# Patient Record
Sex: Female | Born: 1960 | Race: Asian | Hispanic: No | Marital: Married | Smoking: Never smoker
Health system: Southern US, Community
[De-identification: ages and names within clinical notes are randomized; demographics above are authoritative.]

## PROBLEM LIST (undated history)

## (undated) DIAGNOSIS — E7800 Pure hypercholesterolemia, unspecified: Secondary | ICD-10-CM

## (undated) DIAGNOSIS — I10 Essential (primary) hypertension: Secondary | ICD-10-CM

## (undated) DIAGNOSIS — E119 Type 2 diabetes mellitus without complications: Secondary | ICD-10-CM

## (undated) DIAGNOSIS — H9190 Unspecified hearing loss, unspecified ear: Secondary | ICD-10-CM

## (undated) HISTORY — PX: OTHER SURGICAL HISTORY: SHX169

---

## 2020-11-07 ENCOUNTER — Emergency Department (HOSPITAL_BASED_OUTPATIENT_CLINIC_OR_DEPARTMENT_OTHER): Payer: Self-pay

## 2020-11-07 ENCOUNTER — Other Ambulatory Visit: Payer: Self-pay

## 2020-11-07 ENCOUNTER — Encounter (HOSPITAL_BASED_OUTPATIENT_CLINIC_OR_DEPARTMENT_OTHER): Payer: Self-pay | Admitting: *Deleted

## 2020-11-07 ENCOUNTER — Inpatient Hospital Stay (HOSPITAL_BASED_OUTPATIENT_CLINIC_OR_DEPARTMENT_OTHER)
Admission: EM | Admit: 2020-11-07 | Discharge: 2020-11-11 | DRG: 189 | Disposition: A | Discharge status: Home / Self Care | Payer: Self-pay | Attending: Internal Medicine | Admitting: Internal Medicine

## 2020-11-07 DIAGNOSIS — N179 Acute kidney failure, unspecified: Secondary | ICD-10-CM | POA: Diagnosis present

## 2020-11-07 DIAGNOSIS — Z7982 Long term (current) use of aspirin: Secondary | ICD-10-CM

## 2020-11-07 DIAGNOSIS — Q256 Stenosis of pulmonary artery: Secondary | ICD-10-CM

## 2020-11-07 DIAGNOSIS — A419 Sepsis, unspecified organism: Secondary | ICD-10-CM | POA: Diagnosis present

## 2020-11-07 DIAGNOSIS — Z20822 Contact with and (suspected) exposure to covid-19: Secondary | ICD-10-CM | POA: Diagnosis present

## 2020-11-07 DIAGNOSIS — J9811 Atelectasis: Secondary | ICD-10-CM | POA: Diagnosis present

## 2020-11-07 DIAGNOSIS — Z833 Family history of diabetes mellitus: Secondary | ICD-10-CM

## 2020-11-07 DIAGNOSIS — I5031 Acute diastolic (congestive) heart failure: Secondary | ICD-10-CM | POA: Diagnosis present

## 2020-11-07 DIAGNOSIS — M8580 Other specified disorders of bone density and structure, unspecified site: Secondary | ICD-10-CM | POA: Diagnosis present

## 2020-11-07 DIAGNOSIS — H6121 Impacted cerumen, right ear: Secondary | ICD-10-CM | POA: Diagnosis present

## 2020-11-07 DIAGNOSIS — M11241 Other chondrocalcinosis, right hand: Secondary | ICD-10-CM | POA: Diagnosis present

## 2020-11-07 DIAGNOSIS — D509 Iron deficiency anemia, unspecified: Secondary | ICD-10-CM | POA: Diagnosis present

## 2020-11-07 DIAGNOSIS — E1165 Type 2 diabetes mellitus with hyperglycemia: Secondary | ICD-10-CM | POA: Diagnosis present

## 2020-11-07 DIAGNOSIS — D649 Anemia, unspecified: Secondary | ICD-10-CM | POA: Diagnosis present

## 2020-11-07 DIAGNOSIS — J9621 Acute and chronic respiratory failure with hypoxia: Principal | ICD-10-CM | POA: Diagnosis present

## 2020-11-07 DIAGNOSIS — Z794 Long term (current) use of insulin: Secondary | ICD-10-CM

## 2020-11-07 DIAGNOSIS — I959 Hypotension, unspecified: Secondary | ICD-10-CM | POA: Diagnosis present

## 2020-11-07 DIAGNOSIS — E785 Hyperlipidemia, unspecified: Secondary | ICD-10-CM | POA: Diagnosis present

## 2020-11-07 DIAGNOSIS — I10 Essential (primary) hypertension: Secondary | ICD-10-CM | POA: Diagnosis present

## 2020-11-07 DIAGNOSIS — R911 Solitary pulmonary nodule: Secondary | ICD-10-CM | POA: Diagnosis present

## 2020-11-07 DIAGNOSIS — M79641 Pain in right hand: Secondary | ICD-10-CM

## 2020-11-07 DIAGNOSIS — E663 Overweight: Secondary | ICD-10-CM | POA: Diagnosis present

## 2020-11-07 DIAGNOSIS — Z6828 Body mass index (BMI) 28.0-28.9, adult: Secondary | ICD-10-CM

## 2020-11-07 DIAGNOSIS — H9190 Unspecified hearing loss, unspecified ear: Secondary | ICD-10-CM | POA: Diagnosis present

## 2020-11-07 DIAGNOSIS — R0902 Hypoxemia: Secondary | ICD-10-CM

## 2020-11-07 DIAGNOSIS — Z7984 Long term (current) use of oral hypoglycemic drugs: Secondary | ICD-10-CM

## 2020-11-07 HISTORY — DX: Unspecified hearing loss, unspecified ear: H91.90

## 2020-11-07 HISTORY — DX: Essential (primary) hypertension: I10

## 2020-11-07 HISTORY — DX: Type 2 diabetes mellitus without complications: E11.9

## 2020-11-07 HISTORY — DX: Pure hypercholesterolemia, unspecified: E78.00

## 2020-11-07 LAB — CBC WITH DIFFERENTIAL/PLATELET
Abs Immature Granulocytes: 0.05 10*3/uL (ref 0.00–0.07)
Basophils Absolute: 0 10*3/uL (ref 0.0–0.1)
Basophils Relative: 0 %
Eosinophils Absolute: 0 10*3/uL (ref 0.0–0.5)
Eosinophils Relative: 0 %
HCT: 32.6 % — ABNORMAL LOW (ref 36.0–46.0)
Hemoglobin: 10.3 g/dL — ABNORMAL LOW (ref 12.0–15.0)
Immature Granulocytes: 1 %
Lymphocytes Relative: 20 %
Lymphs Abs: 2.1 10*3/uL (ref 0.7–4.0)
MCH: 24.8 pg — ABNORMAL LOW (ref 26.0–34.0)
MCHC: 31.6 g/dL (ref 30.0–36.0)
MCV: 78.6 fL — ABNORMAL LOW (ref 80.0–100.0)
Monocytes Absolute: 0.8 10*3/uL (ref 0.1–1.0)
Monocytes Relative: 8 %
Neutro Abs: 7.4 10*3/uL (ref 1.7–7.7)
Neutrophils Relative %: 71 %
Platelets: 327 10*3/uL (ref 150–400)
RBC: 4.15 MIL/uL (ref 3.87–5.11)
RDW: 15.8 % — ABNORMAL HIGH (ref 11.5–15.5)
WBC: 10.4 10*3/uL (ref 4.0–10.5)
nRBC: 0 % (ref 0.0–0.2)

## 2020-11-07 LAB — BASIC METABOLIC PANEL
Anion gap: 11 (ref 5–15)
BUN: 29 mg/dL — ABNORMAL HIGH (ref 6–20)
CO2: 29 mmol/L (ref 22–32)
Calcium: 9.3 mg/dL (ref 8.9–10.3)
Chloride: 94 mmol/L — ABNORMAL LOW (ref 98–111)
Creatinine, Ser: 1.27 mg/dL — ABNORMAL HIGH (ref 0.44–1.00)
GFR, Estimated: 48 mL/min — ABNORMAL LOW (ref >60)
Glucose, Bld: 161 mg/dL — ABNORMAL HIGH (ref 70–99)
Potassium: 4 mmol/L (ref 3.5–5.1)
Sodium: 134 mmol/L — ABNORMAL LOW (ref 135–145)

## 2020-11-07 LAB — TROPONIN I (HIGH SENSITIVITY): Troponin I (High Sensitivity): 5 ng/L (ref <18)

## 2020-11-07 LAB — RESP PANEL BY RT-PCR (FLU A&B, COVID) ARPGX2
Influenza A by PCR: NEGATIVE
Influenza B by PCR: NEGATIVE
SARS Coronavirus 2 by RT PCR: NEGATIVE

## 2020-11-07 LAB — SEDIMENTATION RATE: Sed Rate: 41 mm/hr — ABNORMAL HIGH (ref 0–22)

## 2020-11-07 LAB — C-REACTIVE PROTEIN: CRP: 11.8 mg/dL — ABNORMAL HIGH (ref <1.0)

## 2020-11-07 LAB — URIC ACID: Uric Acid, Serum: 4.9 mg/dL (ref 2.5–7.1)

## 2020-11-07 MED ORDER — NAPROXEN 375 MG PO TABS: 375.0000 mg | ORAL_TABLET | Freq: Two times a day (BID) | ORAL | 0 refills | Status: DC

## 2020-11-07 MED ORDER — CEPHALEXIN 250 MG PO CAPS
500.0000 mg | ORAL_CAPSULE | Freq: Once | ORAL | Status: AC
  Administered 2020-11-07: 500 mg via ORAL
  Filled 2020-11-07: qty 2

## 2020-11-07 MED ORDER — NALOXONE HCL 0.4 MG/ML IJ SOLN
0.4000 mg | Freq: Once | INTRAMUSCULAR | Status: AC
  Administered 2020-11-07: 0.4 mg via INTRAVENOUS
  Filled 2020-11-07: qty 1

## 2020-11-07 MED ORDER — IOHEXOL 350 MG/ML SOLN
75.0000 mL | Freq: Once | INTRAVENOUS | Status: AC | PRN
  Administered 2020-11-07: 75 mL via INTRAVENOUS

## 2020-11-07 MED ORDER — HYDROCODONE-ACETAMINOPHEN 5-325 MG PO TABS: 1.0000 | ORAL_TABLET | Freq: Four times a day (QID) | ORAL | 0 refills | Status: DC | PRN

## 2020-11-07 MED ORDER — OXYCODONE-ACETAMINOPHEN 5-325 MG PO TABS
1.0000 | ORAL_TABLET | Freq: Once | ORAL | Status: AC
  Administered 2020-11-07: 1 via ORAL
  Filled 2020-11-07: qty 1

## 2020-11-07 MED ORDER — ONDANSETRON 4 MG PO TBDP: 4.0000 mg | ORAL_TABLET | Freq: Three times a day (TID) | ORAL | 0 refills | Status: AC | PRN

## 2020-11-07 MED ORDER — SODIUM CHLORIDE 0.9 % IV BOLUS
1000.0000 mL | Freq: Once | INTRAVENOUS | Status: AC
  Administered 2020-11-07: 1000 mL via INTRAVENOUS

## 2020-11-07 MED ORDER — ONDANSETRON 4 MG PO TBDP
4.0000 mg | ORAL_TABLET | Freq: Once | ORAL | Status: AC
  Administered 2020-11-07: 4 mg via ORAL
  Filled 2020-11-07: qty 1

## 2020-11-07 MED ORDER — SODIUM CHLORIDE 0.9 % IV BOLUS
1000.0000 mL | Freq: Once | INTRAVENOUS | Status: AC
  Administered 2020-11-08: 1000 mL via INTRAVENOUS

## 2020-11-07 MED ORDER — SODIUM CHLORIDE 0.9 % IV SOLN
2.0000 g | Freq: Once | INTRAVENOUS | Status: AC
  Administered 2020-11-08: 2 g via INTRAVENOUS
  Filled 2020-11-07: qty 20

## 2020-11-07 MED ORDER — CLINDAMYCIN PHOSPHATE 600 MG/50ML IV SOLN
600.0000 mg | Freq: Once | INTRAVENOUS | Status: DC
  Filled 2020-11-07: qty 50

## 2020-11-07 MED ORDER — CEPHALEXIN 500 MG PO CAPS: 500.0000 mg | ORAL_CAPSULE | Freq: Four times a day (QID) | ORAL | 0 refills | Status: DC

## 2020-11-07 NOTE — ED Notes (Signed)
Patient transported to CT 

## 2020-11-07 NOTE — ED Notes (Signed)
Appears more comfortable after PO pain med, no further facial grimacing or rocking in chair noted

## 2020-11-07 NOTE — ED Provider Notes (Signed)
Medical screening examination/treatment/procedure(s) were conducted as a shared visit with non-physician practitioner(s) and myself.  I personally evaluated the patient during the encounter.  EKG Interpretation  Date/Time:  Saturday November 07 2020 21:36:35 EST Ventricular Rate:  69 PR Interval:    QRS Duration: 83 QT Interval:  396 QTC Calculation: 425 R Axis:   58 Text Interpretation: Sinus rhythm Confirmed by Tilden Fossa [phone]) on 11/08/2020 5:09:18 PM  Patient presented with severe pain of her right hand.  This is on the dorsum over the hand and wrist.  No trauma.  It was much worse with movement.  Patient initially seen by PA-C Leyden.  Preparations were made for discharge and patient unexpectedly became hypotensive and hypoxic.  This persisted and did not correlate with incidental vasovagal episode or possible reaction to hydrocodone which had been given about 3 hours earlier.  With multiple rechecks and observation, patient repeatedly exhibited hypoxia when taken off oxygen and blood pressures remain very soft.  At this time full diagnostic evaluation initiated to identify sources for hypoxia and hypotension including sepsis, PE rule out and cardiac evaluation.  Patient will require admission for observation and further diagnostic evaluation.  Throughout these episodes, patient did remain alert.  She did not appear obtunded or in respiratory distress.  Patient does not speak English but her general appearance did not suggest these issues.  Lungs remain clear.  Heart Regular.  Aside from the wrist swelling and pain, no other evident source of likely infection by physical exam.  I agree with plan and management.   Arby Barrette, MD 11/08/20 1932

## 2020-11-07 NOTE — ED Triage Notes (Signed)
Right hand swollen, warm to touch x several days. Denies known injury. Pt is deaf-pts son with pt to help translate and communicate.

## 2020-11-07 NOTE — ED Provider Notes (Signed)
[facility] EMERGENCY DEPARTMENT Provider Note   CSN: [id number] Arrival date & time: 11/07/20  1342     History Chief Complaint  Patient presents with  . Hand Pain    Andrea Middleton is a 60 y.o. female with PMH/o DM, HTN who presents for evaluation of right hand/wrist pain that has been going on for the last 2-3 days. No preceding trauma, injury or fall. The hand and wrist has been swollen but they haven't noted any overlying warmth or erythema. She states pain is worse with movement. She has taken Tramadol with no improvement in symptoms. No fevers, numbness/weakness. Patient was told in nepali  That she might have "uric acid" issues but never was officially diagnosed with gout. No wound or animal bite.   The history is provided by the patient and a relative. No language interpreter was used (Son offerred translation given deaf and nepali needs).       Past Medical History:  Diagnosis Date  . Deaf   . Diabetes mellitus without complication (HCC)   . Hypercholesteremia   . Hypertension     Patient Active Problem List   Diagnosis Date Noted  . Severe sepsis (HCC) 11/08/2020    History reviewed. No pertinent surgical history.   OB History   No obstetric history on file.     History reviewed. No pertinent family history.  Social History   Tobacco Use  . Smoking status: Never Smoker  . Smokeless tobacco: Never Used  Substance Use Topics  . Alcohol use: Not Currently  . Drug use: Not Currently    Home Medications Prior to Admission medications   Medication Sig Start Date End Date Taking? Authorizing Provider  cephALEXin (KEFLEX) 500 MG capsule Take 1 capsule (500 mg total) by mouth 4 (four) times daily for 7 days. 11/07/20 11/14/20 Yes Maxwell Caul, PA-C  HYDROcodone-acetaminophen (NORCO/VICODIN) 5-325 MG tablet Take 1-2 tablets by mouth every 6 (six) hours as needed. 11/07/20  Yes Maxwell Caul, PA-C  naproxen (NAPROSYN) 375 MG tablet Take 1  tablet (375 mg total) by mouth 2 (two) times daily for 5 days. 11/07/20 11/12/20 Yes Graciella Freer A, PA-C  ondansetron (ZOFRAN ODT) 4 MG disintegrating tablet Take 1 tablet (4 mg total) by mouth every 8 (eight) hours as needed for nausea or vomiting. 11/07/20  Yes Maxwell Caul, PA-C    Allergies    Patient has no known allergies.  Review of Systems   Review of Systems  Constitutional: Negative for fever.  Musculoskeletal:       Right hand/wrist pain  Skin: Negative for color change.  All other systems reviewed and are negative.   Physical Exam Updated Vital Signs BP (!) 111/53   Pulse 95   Temp 98.7 F (37.1 C) (Oral)   Resp 16   Ht 5\' 3"  (1.6 m)   Wt 71.9 kg   LMP  (LMP Unknown)   SpO2 93%   BMI 28.08 kg/m   Physical Exam Vitals and nursing note reviewed.  Constitutional:      Appearance: She is well-developed and well-nourished.  HENT:     Head: Normocephalic and atraumatic.  Eyes:     General: No scleral icterus.       Right eye: No discharge.        Left eye: No discharge.     Extraocular Movements: EOM normal.     Conjunctiva/sclera: Conjunctivae normal.  Cardiovascular:     Pulses:  Radial pulses are 2+ on the right side and 2+ on the left side.  Pulmonary:     Effort: Pulmonary effort is normal.  Musculoskeletal:     Comments: Tenderness to palpation noted to the dorsal aspect of the right hand with overlying soft tissue swelling and mild warmth. No erythema. Pain with flexion/extension and ulnar/radial deviation of the right hand. She can flex and extend all five digits.   Skin:    General: Skin is warm and dry.     Comments: Good distal cap refill. RUE is not dusky in appearance or cool to touch.  Neurological:     Mental Status: She is alert.  Psychiatric:        Mood and Affect: Mood and affect normal.        Speech: Speech normal.        Behavior: Behavior normal.         ED Results / Procedures / Treatments   Labs (all labs  ordered are listed, but only abnormal results are displayed) Labs Reviewed  BASIC METABOLIC PANEL - Abnormal; Notable for the following components:      Result Value   Sodium 134 (*)    Chloride 94 (*)    Glucose, Bld 161 (*)    BUN 29 (*)    Creatinine, Ser 1.27 (*)    GFR, Estimated 48 (*)    All other components within normal limits  CBC WITH DIFFERENTIAL/PLATELET - Abnormal; Notable for the following components:   Hemoglobin 10.3 (*)    HCT 32.6 (*)    MCV 78.6 (*)    MCH 24.8 (*)    RDW 15.8 (*)    All other components within normal limits  SEDIMENTATION RATE - Abnormal; Notable for the following components:   Sed Rate 41 (*)    All other components within normal limits  C-REACTIVE PROTEIN - Abnormal; Notable for the following components:   CRP 11.8 (*)    All other components within normal limits  LACTIC ACID, PLASMA - Abnormal; Notable for the following components:   Lactic Acid, Venous 0.3 (*)    All other components within normal limits  LACTIC ACID, PLASMA - Abnormal; Notable for the following components:   Lactic Acid, Venous 0.4 (*)    All other components within normal limits  URINALYSIS, ROUTINE W REFLEX MICROSCOPIC - Abnormal; Notable for the following components:   Protein, ur 30 (*)    All other components within normal limits  BASIC METABOLIC PANEL - Abnormal; Notable for the following components:   Sodium 134 (*)    Glucose, Bld 212 (*)    BUN 32 (*)    Creatinine, Ser 1.28 (*)    Calcium 7.9 (*)    GFR, Estimated 48 (*)    All other components within normal limits  URINALYSIS, MICROSCOPIC (REFLEX) - Abnormal; Notable for the following components:   Bacteria, UA FEW (*)    All other components within normal limits  RESP PANEL BY RT-PCR (FLU A&B, COVID) ARPGX2  CULTURE, BLOOD (ROUTINE X 2)  CULTURE, BLOOD (ROUTINE X 2)  URIC ACID  TROPONIN I (HIGH SENSITIVITY)  TROPONIN I (HIGH SENSITIVITY)    EKG EKG Interpretation  Date/Time:  Saturday  November 07 2020 21:36:35 EST Ventricular Rate:  69 PR Interval:    QRS Duration: 83 QT Interval:  396 QTC Calculation: 425 R Axis:   58 Text Interpretation: Sinus rhythm Confirmed by Tilden Fossa [phone]) on 11/08/2020 5:09:18 PM   Radiology  CT Angio Chest PE W and/or Wo Contrast  Result Date: 11/07/2020 CLINICAL DATA:  Low O2 sats EXAM: CT ANGIOGRAPHY CHEST WITH CONTRAST TECHNIQUE: Multidetector CT imaging of the chest was performed using the standard protocol during bolus administration of intravenous contrast. Multiplanar CT image reconstructions and MIPs were obtained to evaluate the vascular anatomy. CONTRAST:  75mL OMNIPAQUE IOHEXOL 350 MG/ML SOLN COMPARISON:  None. FINDINGS: Cardiovascular: There is a optimal opacification of the pulmonary arteries. There is no central,segmental, or subsegmental filling defects within the pulmonary arteries. The heart is normal in size. No pericardial effusion or thickening. No evidence right heart strain. There is normal three-vessel brachiocephalic anatomy without proximal stenosis. The thoracic aorta is normal in appearance. Mediastinum/Nodes: No hilar, mediastinal, or axillary adenopathy. Thyroid gland, trachea, and esophagus demonstrate no significant findings. Lungs/Pleura: Hazy airspace opacity seen at the periphery of the periphery of the posterior left lung base. There is a small 4 mm nodule seen within the anterior right middle lobe. No pleural effusion or pneumothorax is noted. Upper Abdomen: No acute abnormalities present in the visualized portions of the upper abdomen. Musculoskeletal: No chest wall abnormality. There is an 8 mm round sclerotic lesion seen within the T10 vertebral body. Review of the MIP images confirms the above findings. IMPRESSION: No central, segmental, or subsegmental pulmonary embolism. Mildly hazy airspace opacity within the posterior left lung base which could be due to atelectasis and/or early infectious etiology. 4 mm  pulmonary nodule within the right middle lobe. No follow-up needed if patient is low-risk. Non-contrast chest CT can be considered in 12 months if patient is high-risk. This recommendation follows the consensus statement: Guidelines for Management of Incidental Pulmonary Nodules Detected on CT Images: From the Fleischner Society 2017; Radiology 2017; 284:228-243. Electronically Signed   By: Jonna Clark M.D.   On: 11/07/2020 23:17   DG Chest Portable 1 View  Result Date: 11/07/2020 CLINICAL DATA:  Low O2 sats EXAM: PORTABLE CHEST 1 VIEW COMPARISON:  None. FINDINGS: The heart size and mediastinal contours are within normal limits. There appears to be a hazy left perihilar opacities. A somewhat nodular appearance is seen within the left perihilar region which measures 1 cm. The visualized skeletal structures are unremarkable. IMPRESSION: Left perihilar airspace opacities and a 1 cm left perihilar nodular opacity, which is non-specific and would recommend dedicated chest CT for further evaluation. Electronically Signed   By: Jonna Clark M.D.   On: 11/07/2020 22:21   DG Hand Complete Right  Result Date: 11/07/2020 CLINICAL DATA:  Acute onset right hand pain and swelling. No injury. EXAM: RIGHT HAND - COMPLETE 3+ VIEW COMPARISON:  None. FINDINGS: Alignment is normal. There is mild periarticular osteopenia. No bony erosions. Minimal degenerative change over the first carpometacarpal joint and scaphoid trapezium joint. No fracture or dislocation. IMPRESSION: 1. No acute findings. 2. Mild degenerative change as described. Electronically Signed   By: Elberta Fortis M.D.   On: 11/07/2020 15:44    Procedures Procedures (including critical care time)  Medications Ordered in ED Medications  oxyCODONE-acetaminophen (PERCOCET/ROXICET) 5-325 MG per tablet 1 tablet (1 tablet Oral Given 11/07/20 1833)  ondansetron (ZOFRAN-ODT) disintegrating tablet 4 mg (4 mg Oral Given 11/07/20 2029)  cephALEXin (KEFLEX) capsule 500  mg (500 mg Oral Given 11/07/20 2212)  naloxone [facility]) injection 0.4 mg (0.4 mg Intravenous Given 11/07/20 2205)  sodium chloride 0.9 % bolus 1,000 mL (0 mLs Intravenous Stopped 11/07/20 2335)  iohexol (OMNIPAQUE) 350 MG/ML injection 75 mL (75 mLs Intravenous Contrast Given  11/07/20 2246)  sodium chloride 0.9 % bolus 1,000 mL (0 mLs Intravenous Stopped 11/08/20 0134)  cefTRIAXone (ROCEPHIN) 2 g in sodium chloride 0.9 % 100 mL IVPB (0 g Intravenous Stopped 11/08/20 0052)  sodium chloride 0.9 % bolus 1,000 mL (0 mLs Intravenous Stopped 11/08/20 1012)    ED Course  I have reviewed the triage vital signs and the nursing notes.  Pertinent labs & imaging results that were available during my care of the patient were reviewed by me and considered in my medical decision making (see chart for details).    MDM Rules/Calculators/A&P                          60 y.o. F with PMH/o DM, HTN who presents for evaluation right hand and wrist pain that began about 3 days ago.  No preceding trauma, injury.  No fever.  They have noticed it has been swollen but they have not noticed any warmth, erythema.  No numbness/weakness.  On initial arrival, she is afebrile, nontoxic-appearing.  Vital signs are stable.  She is neurovascularly intact.  She does have some swelling noted to the dorsal aspect of her right hand and right wrist.  It is mildly warm to touch but it is not erythematous.  She does have some pain with movement of the wrist.  X-rays ordered at triage.  No history of gout.  Concern for possible infectious versus inflammatory process.  History/physical exam nonconcerning for ischemic limb.  X-ray reviewed.  Negative for any acute bony abnormality.  CBC shows no leukocytosis.  Hemoglobin is 10.3.  BMP shows BUN of 29, creatinine 1.27.  Uric acid is normal.  ESR is elevated at 41.  Unfortunately at our facility, CRP is a send out will not come back for several later.  I discussed with Dr. Izora Ribas (hand).  He  recommends treating this with antibiotics and anti-inflammatories and putting patient in a splint.  If she does not have any improvement in 72 hours, he recommends return to the ED otherwise patient can follow-up in outpatient.   Updated patient and son on plan. They are agreeable. Patient with no known drug allergies. At this time, patient exhibits no emergent life-threatening condition that require further evaluation in ED. Patient had ample opportunity for questions and discussion. All patient's questions were answered with full understanding. Strict return precautions discussed. Patient expresses understanding and agreement to plan.   RN informed me that while patient was getting repeat vitals for discharge, they noted that her O2 sats were ranging between 88-93% on room air.  She had an episode where she dipped into the 70s.  I was informed.  I asked them to repeat her vitals and to monitor to see if this was the hydrocodone that could potentially be causing this hypoxia.  When I went to evaluate the patient, RN informed me that patient had an episode where she was lethargic, vomiting, diaphoretic and O2 dropped down into the 50s.  EDP was informed and was at bedside.  Patient has been vaccinated for COVID.  We will plan to give her Narcan, check a chest x-ray, EKG.  Patient also slightly hypotensive.  Will give fluids.  Chest x-ray shows left perihilar airspace opacities and a 1 cm perihilar nodule opacity.  Patient did not have any improvement with Narcan.  She is still hypoxic.  We will plan to get a CTA to evaluate for any potential blood clot that could be causing  her symptoms.  CTA shows no evidence of PE.  There is a mild hazy airspace opacity within the posterior left lung base which could be due to atelectasis.  There is a 4 mm pulmonary nodule within the right middle lobe.  COVID test is negative. Patient has been vaccinated for COVID.   Re-evaluation.  Patient is hypotensive with  systolic blood pressures in the 80s.  She states that the pain in her hand has improved but her O2 sats will continuously drop.  She was on 4 L and maintaining O2 sats between 96-97.  We turned her off and she dropped down to 82% with a good [place].  At this time, she is requiring new oxygen, and has soft blood pressures.  She is requiring 4 mL of oxygen to maintain O2 sats.  We will plan to admit her.  Question of this is early sepsis from cellulitis versus pneumonia.  Patient started on antibiotics.  Discussed patient with Dr. Para March (hospitalist) who accepts patient for admission.   Portions of this note were generated with Scientist, clinical (histocompatibility and immunogenetics). Dictation errors may occur despite best attempts at proofreading.   Final Clinical Impression(s) / ED Diagnoses Final diagnoses:  Right hand pain  Hypoxia    Rx / DC Orders ED Discharge Orders         Ordered    cephALEXin (KEFLEX) 500 MG capsule  4 times daily        11/07/20 2110    HYDROcodone-acetaminophen (NORCO/VICODIN) 5-325 MG tablet  Every 6 hours PRN        11/07/20 2110    ondansetron (ZOFRAN ODT) 4 MG disintegrating tablet  Every 8 hours PRN        11/07/20 2110    naproxen (NAPROSYN) 375 MG tablet  2 times daily        11/07/20 2110           Rosana Hoes 11/08/20 1814    Arby Barrette, MD 11/08/20 1932

## 2020-11-07 NOTE — ED Notes (Signed)
Patient noted to be nauseous, sats in the low 80's, dusky. MD notified and at bedside in fast track. O2 applied, sats improved. Moved to room 1. EKG performed, O2 continued. Sats 77% on RA on arrival to room. Placed on 4L [place] per RT. Pt alert and responsive throughout episode. Son at bedside.

## 2020-11-07 NOTE — ED Notes (Signed)
Began having rt hand pain Thursday PM, today noted rt hand / right digiti's are swollen, warm to touch, tender to palpation, denies any injuries, cuts etc. Has increased pain when trying to move fingers or grip hand.

## 2020-11-07 NOTE — Discharge Instructions (Addendum)
Take antibiotics as directed. Please take all of your antibiotics until finished.  Take Naproxen as directed.   Take pain medications as directed for break through pain. Do not drive or operate machinery while taking this medication.   Take zofran for nausea.   Wear the brace for support and stabilization. Apply ice to help with pain and swelling.   Follow up with Dr. Debby Bud office for further evaluation if symptoms do not improve.   Return to the Emergency Dept for any fevers, numbness/weakness, worsening swelling or redness that begins to spread up.

## 2020-11-07 NOTE — ED Notes (Addendum)
At approx 2130 EMT called over radio for RN; EMT told this RN that pt O2 sat was hanging out at 88-93% on RA; while observing pt, O2 went down into the 70s%; this RN listened to lung sounds and told EMT to switch pulse oximeter and went to get EDP; PA was informed, told this RN to monitor pt for a few minutes; when this RN went back in, pt O2 was in 50s% on RA, was lethargic, diaphoretic, grey skinned and vomiting; this RN called for RT on radio and went to get EDP; EDP Pfieffer came to room, RT put 4LNC on pt and pt was transferred to room for further evaluation

## 2020-11-08 ENCOUNTER — Encounter (HOSPITAL_BASED_OUTPATIENT_CLINIC_OR_DEPARTMENT_OTHER): Payer: Self-pay | Admitting: Internal Medicine

## 2020-11-08 DIAGNOSIS — J9621 Acute and chronic respiratory failure with hypoxia: Secondary | ICD-10-CM | POA: Diagnosis present

## 2020-11-08 DIAGNOSIS — A419 Sepsis, unspecified organism: Secondary | ICD-10-CM | POA: Diagnosis present

## 2020-11-08 DIAGNOSIS — R0902 Hypoxemia: Secondary | ICD-10-CM | POA: Diagnosis present

## 2020-11-08 LAB — BASIC METABOLIC PANEL
Anion gap: 8 (ref 5–15)
BUN: 32 mg/dL — ABNORMAL HIGH (ref 6–20)
CO2: 25 mmol/L (ref 22–32)
Calcium: 7.9 mg/dL — ABNORMAL LOW (ref 8.9–10.3)
Chloride: 101 mmol/L (ref 98–111)
Creatinine, Ser: 1.28 mg/dL — ABNORMAL HIGH (ref 0.44–1.00)
GFR, Estimated: 48 mL/min — ABNORMAL LOW (ref >60)
Glucose, Bld: 212 mg/dL — ABNORMAL HIGH (ref 70–99)
Potassium: 4.7 mmol/L (ref 3.5–5.1)
Sodium: 134 mmol/L — ABNORMAL LOW (ref 135–145)

## 2020-11-08 LAB — URINALYSIS, MICROSCOPIC (REFLEX)

## 2020-11-08 LAB — URINALYSIS, ROUTINE W REFLEX MICROSCOPIC
Bilirubin Urine: NEGATIVE
Glucose, UA: NEGATIVE mg/dL
Hgb urine dipstick: NEGATIVE
Ketones, ur: NEGATIVE mg/dL
Leukocytes,Ua: NEGATIVE
Nitrite: NEGATIVE
Protein, ur: 30 mg/dL — AB
Specific Gravity, Urine: 1.01 (ref 1.005–1.030)
pH: 5.5 (ref 5.0–8.0)

## 2020-11-08 LAB — LACTIC ACID, PLASMA
Lactic Acid, Venous: 0.3 mmol/L — ABNORMAL LOW (ref 0.5–1.9)
Lactic Acid, Venous: 0.4 mmol/L — ABNORMAL LOW (ref 0.5–1.9)

## 2020-11-08 LAB — TROPONIN I (HIGH SENSITIVITY): Troponin I (High Sensitivity): 5 ng/L (ref <18)

## 2020-11-08 MED ORDER — LACTATED RINGERS IV SOLN: INTRAVENOUS | Status: AC

## 2020-11-08 MED ORDER — SODIUM CHLORIDE 0.9 % IV BOLUS
1000.0000 mL | Freq: Once | INTRAVENOUS | Status: AC
  Administered 2020-11-08: 1000 mL via INTRAVENOUS

## 2020-11-08 NOTE — ED Provider Notes (Signed)
Nursing notes and vitals signs, including pulse oximetry, reviewed.  Summary of this visit's results, reviewed by myself:  EKG:  EKG Interpretation  Date/Time:    Ventricular Rate:    PR Interval:    QRS Duration:   QT Interval:    QTC Calculation:   R Axis:     Text Interpretation:         Labs:  Results for orders placed or performed during the hospital encounter of 11/07/20 (from the past 24 hour(s))  Basic metabolic panel     Status: Abnormal   Collection Time: 11/07/20  6:40 PM  Result Value Ref Range   Sodium 134 (L) 135 - 145 mmol/L   Potassium 4.0 3.5 - 5.1 mmol/L   Chloride 94 (L) 98 - 111 mmol/L   CO2 29 22 - 32 mmol/L   Glucose, Bld 161 (H) 70 - 99 mg/dL   BUN 29 (H) 6 - 20 mg/dL   Creatinine, Ser 4.17 (H) 0.44 - 1.00 mg/dL   Calcium 9.3 8.9 - 40.8 mg/dL   GFR, Estimated 48 (L) >60 mL/min   Anion gap 11 5 - 15  CBC with Differential     Status: Abnormal   Collection Time: 11/07/20  6:40 PM  Result Value Ref Range   WBC 10.4 4.0 - 10.5 K/uL   RBC 4.15 3.87 - 5.11 MIL/uL   Hemoglobin 10.3 (L) 12.0 - 15.0 g/dL   HCT 14.4 (L) 81.8 - 56.3 %   MCV 78.6 (L) 80.0 - 100.0 fL   MCH 24.8 (L) 26.0 - 34.0 pg   MCHC 31.6 30.0 - 36.0 g/dL   RDW 14.9 (H) 70.2 - 63.7 %   Platelets 327 150 - 400 K/uL   nRBC 0.0 0.0 - 0.2 %   Neutrophils Relative % 71 %   Neutro Abs 7.4 1.7 - 7.7 K/uL   Lymphocytes Relative 20 %   Lymphs Abs 2.1 0.7 - 4.0 K/uL   Monocytes Relative 8 %   Monocytes Absolute 0.8 0.1 - 1.0 K/uL   Eosinophils Relative 0 %   Eosinophils Absolute 0.0 0.0 - 0.5 K/uL   Basophils Relative 0 %   Basophils Absolute 0.0 0.0 - 0.1 K/uL   Immature Granulocytes 1 %   Abs Immature Granulocytes 0.05 0.00 - 0.07 K/uL  Sedimentation rate     Status: Abnormal   Collection Time: 11/07/20  6:40 PM  Result Value Ref Range   Sed Rate 41 (H) 0 - 22 mm/hr  Uric acid     Status: None   Collection Time: 11/07/20  6:40 PM  Result Value Ref Range   Uric Acid, Serum 4.9 2.5  - 7.1 mg/dL  C-reactive protein     Status: Abnormal   Collection Time: 11/07/20  6:40 PM  Result Value Ref Range   CRP 11.8 (H) <1.0 mg/dL  Resp Panel by RT-PCR (Flu A&B, Covid) Nasopharyngeal Swab     Status: None   Collection Time: 11/07/20  9:45 PM   Specimen: Nasopharyngeal Swab; Nasopharyngeal(NP) swabs in vial transport medium  Result Value Ref Range   SARS Coronavirus 2 by RT PCR NEGATIVE NEGATIVE   Influenza A by PCR NEGATIVE NEGATIVE   Influenza B by PCR NEGATIVE NEGATIVE  Troponin I (High Sensitivity)     Status: None   Collection Time: 11/07/20 11:02 PM  Result Value Ref Range   Troponin I (High Sensitivity) 5 <18 ng/L  Lactic acid, plasma     Status: Abnormal  Collection Time: 11/08/20 12:12 AM  Result Value Ref Range   Lactic Acid, Venous 0.3 (L) 0.5 - 1.9 mmol/L  Troponin I (High Sensitivity)     Status: None   Collection Time: 11/08/20 12:12 AM  Result Value Ref Range   Troponin I (High Sensitivity) 5 <18 ng/L  Lactic acid, plasma     Status: Abnormal   Collection Time: 11/08/20  1:55 AM  Result Value Ref Range   Lactic Acid, Venous 0.4 (L) 0.5 - 1.9 mmol/L  Urinalysis, Routine w reflex microscopic Urine, Clean Catch     Status: Abnormal   Collection Time: 11/08/20  3:34 AM  Result Value Ref Range   Color, Urine YELLOW YELLOW   APPearance CLEAR CLEAR   Specific Gravity, Urine 1.010 1.005 - 1.030   pH 5.5 5.0 - 8.0   Glucose, UA NEGATIVE NEGATIVE mg/dL   Hgb urine dipstick NEGATIVE NEGATIVE   Bilirubin Urine NEGATIVE NEGATIVE   Ketones, ur NEGATIVE NEGATIVE mg/dL   Protein, ur 30 (A) NEGATIVE mg/dL   Nitrite NEGATIVE NEGATIVE   Leukocytes,Ua NEGATIVE NEGATIVE  Basic metabolic panel     Status: Abnormal   Collection Time: 11/08/20  3:34 AM  Result Value Ref Range   Sodium 134 (L) 135 - 145 mmol/L   Potassium 4.7 3.5 - 5.1 mmol/L   Chloride 101 98 - 111 mmol/L   CO2 25 22 - 32 mmol/L   Glucose, Bld 212 (H) 70 - 99 mg/dL   BUN 32 (H) 6 - 20 mg/dL    Creatinine, Ser 3.54 (H) 0.44 - 1.00 mg/dL   Calcium 7.9 (L) 8.9 - 10.3 mg/dL   GFR, Estimated 48 (L) >60 mL/min   Anion gap 8 5 - 15  Urinalysis, Microscopic (reflex)     Status: Abnormal   Collection Time: 11/08/20  3:34 AM  Result Value Ref Range   RBC / HPF 0-5 0 - 5 RBC/hpf   WBC, UA 0-5 0 - 5 WBC/hpf   Bacteria, UA FEW (A) NONE SEEN   Squamous Epithelial / LPF 0-5 0 - 5   Hyaline Casts, UA PRESENT    Granular Casts, UA PRESENT     Imaging Studies: CT Angio Chest PE W and/or Wo Contrast  Result Date: 11/07/2020 CLINICAL DATA:  Low O2 sats EXAM: CT ANGIOGRAPHY CHEST WITH CONTRAST TECHNIQUE: Multidetector CT imaging of the chest was performed using the standard protocol during bolus administration of intravenous contrast. Multiplanar CT image reconstructions and MIPs were obtained to evaluate the vascular anatomy. CONTRAST:  51mL OMNIPAQUE IOHEXOL 350 MG/ML SOLN COMPARISON:  None. FINDINGS: Cardiovascular: There is a optimal opacification of the pulmonary arteries. There is no central,segmental, or subsegmental filling defects within the pulmonary arteries. The heart is normal in size. No pericardial effusion or thickening. No evidence right heart strain. There is normal three-vessel brachiocephalic anatomy without proximal stenosis. The thoracic aorta is normal in appearance. Mediastinum/Nodes: No hilar, mediastinal, or axillary adenopathy. Thyroid gland, trachea, and esophagus demonstrate no significant findings. Lungs/Pleura: Hazy airspace opacity seen at the periphery of the periphery of the posterior left lung base. There is a small 4 mm nodule seen within the anterior right middle lobe. No pleural effusion or pneumothorax is noted. Upper Abdomen: No acute abnormalities present in the visualized portions of the upper abdomen. Musculoskeletal: No chest wall abnormality. There is an 8 mm round sclerotic lesion seen within the T10 vertebral body. Review of the MIP images confirms the above  findings. IMPRESSION: No central, segmental,  or subsegmental pulmonary embolism. Mildly hazy airspace opacity within the posterior left lung base which could be due to atelectasis and/or early infectious etiology. 4 mm pulmonary nodule within the right middle lobe. No follow-up needed if patient is low-risk. Non-contrast chest CT can be considered in 12 months if patient is high-risk. This recommendation follows the consensus statement: Guidelines for Management of Incidental Pulmonary Nodules Detected on CT Images: From the Fleischner Society 2017; Radiology 2017; 284:228-243. Electronically Signed   By: Jonna Clark M.D.   On: 11/07/2020 23:17   DG Chest Portable 1 View  Result Date: 11/07/2020 CLINICAL DATA:  Low O2 sats EXAM: PORTABLE CHEST 1 VIEW COMPARISON:  None. FINDINGS: The heart size and mediastinal contours are within normal limits. There appears to be a hazy left perihilar opacities. A somewhat nodular appearance is seen within the left perihilar region which measures 1 cm. The visualized skeletal structures are unremarkable. IMPRESSION: Left perihilar airspace opacities and a 1 cm left perihilar nodular opacity, which is non-specific and would recommend dedicated chest CT for further evaluation. Electronically Signed   By: Jonna Clark M.D.   On: 11/07/2020 22:21   DG Hand Complete Right  Result Date: 11/07/2020 CLINICAL DATA:  Acute onset right hand pain and swelling. No injury. EXAM: RIGHT HAND - COMPLETE 3+ VIEW COMPARISON:  None. FINDINGS: Alignment is normal. There is mild periarticular osteopenia. No bony erosions. Minimal degenerative change over the first carpometacarpal joint and scaphoid trapezium joint. No fracture or dislocation. IMPRESSION: 1. No acute findings. 2. Mild degenerative change as described. Electronically Signed   By: Elberta Fortis M.D.   On: 11/07/2020 15:44   3:17 AM SBP dropped into the 70s at 1 point but has returned to normal without additional fluid bolus.   Nursing staff advises me the patient has not urinated in the 13 hours she has been in the ED.  We will encourage her to provide a specimen and recheck her BMET.  4:19 AM Patient voided about 150 mL of urine.  No significant deterioration in kidney function seen on repeat BMET.  6:14 AM Patient's systolic blood pressure is now 105 although it has dipped down into the 90s over the past hour.  She is mentating well and in no distress.  She has no significant edema despite being given 2 L of fluid and having limited urine output.  She has only 1 small, serous fluid containing blister on her lower back otherwise her skin is intact without rash or lesion.  Her oxygen saturation is 99% on 4 L by nasal cannula.  The cause of her hypoxia is still unclear given her limited findings on CT scan.  A false negative COVID test is a possibility.        Karalynn Cottone, Jonny Ruiz, MD 11/08/20 [phone]

## 2020-11-08 NOTE — ED Notes (Addendum)
MD adjusted O2 back to 2L/[place], SaO2 89, O2 increased to 3L/, MD notified

## 2020-11-08 NOTE — ED Notes (Signed)
SaO2 92 percent on 3L/[place]

## 2020-11-08 NOTE — ED Notes (Signed)
Pt BP continues to decrease. Provider made aware and came to bedside to reassess pt. Pt currently denies SOB and is no longer nauseous. Is only drowsy at this time with complaints of pain to R hand. Pt also began to desat while staff was in the room. Lowest 02 witnessed was 81% on room air. RT called to bedside. Pt was placed on 4L [place] and began to improve. Awaiting new orders.

## 2020-11-08 NOTE — ED Notes (Signed)
ED Provider at bedside. 

## 2020-11-08 NOTE — ED Notes (Signed)
SaO2 of 95 percent on 4L/[place], O2 decreased to 3L/[place] will continue to monitor and adjust accordingly

## 2020-11-08 NOTE — ED Notes (Signed)
SaO2 at 84 percent with good wave form, O2 increased to 3L/[place], pt sleeping, upon waking pt, SaO2 increased to 88 percent  On 3L/[place], MD notified and O2 increased to 4L/[place], no distress, son remains at bedside

## 2020-11-08 NOTE — ED Notes (Signed)
SaO2 now at on 4L/[place] 96 percent

## 2020-11-08 NOTE — ED Notes (Signed)
Pt o2 sat down to 88%. Increased 02 to 4L.

## 2020-11-08 NOTE — ED Notes (Signed)
Oxygen decreased to 3L/[place] with SaO2 of 93 percent

## 2020-11-08 NOTE — ED Provider Notes (Signed)
  Physical Exam  BP 111/62   Pulse 92   Temp 98.7 F (37.1 C) (Oral)   Resp 15   Ht 5\' 3"  (1.6 m)   Wt 71.9 kg   LMP  (LMP Unknown)   SpO2 94%   BMI 28.08 kg/m   Physical Exam  ED Course/Procedures     .Critical Care Performed by: , MD Authorized by: Derwood Kaplan, MD   Critical care provider statement:    Critical care time (minutes):  32   Critical care was necessary to treat or prevent imminent or life-threatening deterioration of the following conditions:  Respiratory failure   Critical care was time spent personally by me on the following activities:  Discussions with consultants, evaluation of patient's response to treatment, examination of patient, ordering and performing treatments and interventions, ordering and review of laboratory studies, ordering and review of radiographic studies, pulse oximetry, re-evaluation of patient's condition, obtaining history from patient or surrogate and review of old charts    MDM    Patient was admitted for cellulitis and acute hypoxia.  Her BP has stabilized.  She is awaiting bed at [facility].  She is slated for stepdown unit.  I reassessed the patient at the request of the family.  She is hypoxic on room air with O2 sats dropping to 82%.  She is back on 3 L of oxygen.  Blood pressure has stayed stable.  Labs reviewed.  Suspect possibly an element of OSA.  CT scans reviewed.  Patient and family does not have any other complaints.  I feel comfortable changing patient's bed to telemetry.  She has been stable, not requiring any pressors.  She is started voiding.  I spoke with Dr. [facility], who agrees.      Mikeal Hawthorne, MD 11/08/20 [phone]

## 2020-11-08 NOTE — ED Notes (Signed)
SaO2 decreased to 88 percent with good wave form, O2 increased to 4L/[place]

## 2020-11-08 NOTE — ED Notes (Signed)
Dr. Read Drivers made aware of low blood pressures.  Son remains at the bedside.  Encouraged to call for assistance as needed.

## 2020-11-08 NOTE — ED Notes (Signed)
Pt assisted up to [facility] 100 cc urine , son at bedside, pt aao communicating with son, room straightened clothes placed in belongings bag

## 2020-11-09 ENCOUNTER — Inpatient Hospital Stay (HOSPITAL_COMMUNITY): Payer: Self-pay

## 2020-11-09 DIAGNOSIS — D649 Anemia, unspecified: Secondary | ICD-10-CM | POA: Diagnosis present

## 2020-11-09 DIAGNOSIS — E785 Hyperlipidemia, unspecified: Secondary | ICD-10-CM | POA: Diagnosis present

## 2020-11-09 DIAGNOSIS — I5031 Acute diastolic (congestive) heart failure: Secondary | ICD-10-CM

## 2020-11-09 DIAGNOSIS — N179 Acute kidney failure, unspecified: Secondary | ICD-10-CM | POA: Diagnosis present

## 2020-11-09 DIAGNOSIS — E663 Overweight: Secondary | ICD-10-CM | POA: Diagnosis present

## 2020-11-09 DIAGNOSIS — M11241 Other chondrocalcinosis, right hand: Secondary | ICD-10-CM

## 2020-11-09 DIAGNOSIS — E1165 Type 2 diabetes mellitus with hyperglycemia: Secondary | ICD-10-CM | POA: Diagnosis present

## 2020-11-09 DIAGNOSIS — R911 Solitary pulmonary nodule: Secondary | ICD-10-CM

## 2020-11-09 DIAGNOSIS — I10 Essential (primary) hypertension: Secondary | ICD-10-CM

## 2020-11-09 DIAGNOSIS — H6121 Impacted cerumen, right ear: Secondary | ICD-10-CM | POA: Diagnosis present

## 2020-11-09 LAB — ECHOCARDIOGRAM COMPLETE
Area-P 1/2: 3.21 cm2
Height: 63 in
S' Lateral: 2.8 cm
Single Plane A4C EF: 55.7 %
Weight: 2536 oz

## 2020-11-09 LAB — CBC WITH DIFFERENTIAL/PLATELET
Abs Immature Granulocytes: 0.13 10*3/uL — ABNORMAL HIGH (ref 0.00–0.07)
Basophils Absolute: 0 10*3/uL (ref 0.0–0.1)
Basophils Relative: 0 %
Eosinophils Absolute: 0 10*3/uL (ref 0.0–0.5)
Eosinophils Relative: 0 %
HCT: 27.4 % — ABNORMAL LOW (ref 36.0–46.0)
Hemoglobin: 8.5 g/dL — ABNORMAL LOW (ref 12.0–15.0)
Immature Granulocytes: 1 %
Lymphocytes Relative: 11 %
Lymphs Abs: 1 10*3/uL (ref 0.7–4.0)
MCH: 24.7 pg — ABNORMAL LOW (ref 26.0–34.0)
MCHC: 31 g/dL (ref 30.0–36.0)
MCV: 79.7 fL — ABNORMAL LOW (ref 80.0–100.0)
Monocytes Absolute: 0.7 10*3/uL (ref 0.1–1.0)
Monocytes Relative: 8 %
Neutro Abs: 7.4 10*3/uL (ref 1.7–7.7)
Neutrophils Relative %: 80 %
Platelets: 260 10*3/uL (ref 150–400)
RBC: 3.44 MIL/uL — ABNORMAL LOW (ref 3.87–5.11)
RDW: 15.7 % — ABNORMAL HIGH (ref 11.5–15.5)
WBC: 9.3 10*3/uL (ref 4.0–10.5)
nRBC: 0 % (ref 0.0–0.2)

## 2020-11-09 LAB — BASIC METABOLIC PANEL
Anion gap: 10 (ref 5–15)
BUN: 23 mg/dL — ABNORMAL HIGH (ref 6–20)
CO2: 28 mmol/L (ref 22–32)
Calcium: 8.8 mg/dL — ABNORMAL LOW (ref 8.9–10.3)
Chloride: 99 mmol/L (ref 98–111)
Creatinine, Ser: 1 mg/dL (ref 0.44–1.00)
GFR, Estimated: >60 mL/min (ref >60)
Glucose, Bld: 108 mg/dL — ABNORMAL HIGH (ref 70–99)
Potassium: 3.7 mmol/L (ref 3.5–5.1)
Sodium: 137 mmol/L (ref 135–145)

## 2020-11-09 LAB — HEPATIC FUNCTION PANEL
ALT: 17 U/L (ref 0–44)
AST: 20 U/L (ref 15–41)
Albumin: 3.2 g/dL — ABNORMAL LOW (ref 3.5–5.0)
Alkaline Phosphatase: 44 U/L (ref 38–126)
Bilirubin, Direct: 0.2 mg/dL (ref 0.0–0.2)
Indirect Bilirubin: 0.3 mg/dL (ref 0.3–0.9)
Total Bilirubin: 0.5 mg/dL (ref 0.3–1.2)
Total Protein: 6.7 g/dL (ref 6.5–8.1)

## 2020-11-09 LAB — CBG MONITORING, ED
Glucose-Capillary: 106 mg/dL — ABNORMAL HIGH (ref 70–99)
Glucose-Capillary: 85 mg/dL (ref 70–99)
Glucose-Capillary: 88 mg/dL (ref 70–99)

## 2020-11-09 LAB — PROCALCITONIN: Procalcitonin: <0.1 ng/mL

## 2020-11-09 LAB — BRAIN NATRIURETIC PEPTIDE: B Natriuretic Peptide: 482.8 pg/mL — ABNORMAL HIGH (ref 0.0–100.0)

## 2020-11-09 LAB — GLUCOSE, CAPILLARY
Glucose-Capillary: 143 mg/dL — ABNORMAL HIGH (ref 70–99)
Glucose-Capillary: 139 mg/dL — ABNORMAL HIGH (ref 70–99)
Glucose-Capillary: 221 mg/dL — ABNORMAL HIGH (ref 70–99)

## 2020-11-09 LAB — HEMOGLOBIN A1C
Hgb A1c MFr Bld: 7.3 % — ABNORMAL HIGH (ref 4.8–5.6)
Mean Plasma Glucose: 162.81 mg/dL

## 2020-11-09 LAB — HIV ANTIBODY (ROUTINE TESTING W REFLEX): HIV Screen 4th Generation wRfx: NONREACTIVE

## 2020-11-09 MED ORDER — METHYLPREDNISOLONE 4 MG PO TBPK
4.0000 mg | ORAL_TABLET | ORAL | Status: AC
  Administered 2020-11-09: 4 mg via ORAL

## 2020-11-09 MED ORDER — CARVEDILOL 25 MG PO TABS
25.0000 mg | ORAL_TABLET | Freq: Two times a day (BID) | ORAL | Status: DC
Start: 2020-11-09 — End: 2020-11-11
  Administered 2020-11-09 – 2020-11-11 (×5): 25 mg via ORAL
  Filled 2020-11-09 (×5): qty 1

## 2020-11-09 MED ORDER — ONDANSETRON HCL 4 MG PO TABS: 4.0000 mg | ORAL_TABLET | Freq: Four times a day (QID) | ORAL | Status: DC | PRN

## 2020-11-09 MED ORDER — FUROSEMIDE 10 MG/ML IJ SOLN
20.0000 mg | Freq: Two times a day (BID) | INTRAMUSCULAR | Status: DC
  Administered 2020-11-09 – 2020-11-10 (×2): 20 mg via INTRAVENOUS
  Filled 2020-11-09 (×2): qty 2

## 2020-11-09 MED ORDER — ATORVASTATIN CALCIUM 40 MG PO TABS
40.0000 mg | ORAL_TABLET | Freq: Every day | ORAL | Status: DC
Start: 2020-11-09 — End: 2020-11-11
  Administered 2020-11-09 – 2020-11-10 (×2): 40 mg via ORAL
  Filled 2020-11-09 (×2): qty 1

## 2020-11-09 MED ORDER — METHYLPREDNISOLONE 4 MG PO TBPK
4.0000 mg | ORAL_TABLET | Freq: Three times a day (TID) | ORAL | Status: DC
  Administered 2020-11-10 (×2): 4 mg via ORAL

## 2020-11-09 MED ORDER — ONDANSETRON HCL 4 MG/2ML IJ SOLN: 4.0000 mg | Freq: Four times a day (QID) | INTRAMUSCULAR | Status: DC | PRN

## 2020-11-09 MED ORDER — ACETAMINOPHEN 650 MG RE SUPP: 650.0000 mg | Freq: Four times a day (QID) | RECTAL | Status: DC | PRN

## 2020-11-09 MED ORDER — METHYLPREDNISOLONE 4 MG PO TBPK: 4.0000 mg | ORAL_TABLET | Freq: Four times a day (QID) | ORAL | Status: DC

## 2020-11-09 MED ORDER — ALBUTEROL SULFATE (2.5 MG/3ML) 0.083% IN NEBU: 2.5000 mg | INHALATION_SOLUTION | RESPIRATORY_TRACT | Status: DC | PRN

## 2020-11-09 MED ORDER — ASPIRIN EC 81 MG PO TBEC
81.0000 mg | DELAYED_RELEASE_TABLET | Freq: Every day | ORAL | Status: DC
  Administered 2020-11-09 – 2020-11-11 (×3): 81 mg via ORAL
  Filled 2020-11-09 (×3): qty 1

## 2020-11-09 MED ORDER — METHYLPREDNISOLONE 4 MG PO TBPK
8.0000 mg | ORAL_TABLET | Freq: Every morning | ORAL | Status: AC
  Administered 2020-11-09: 8 mg via ORAL
  Filled 2020-11-09: qty 21

## 2020-11-09 MED ORDER — FENOFIBRATE 54 MG PO TABS
54.0000 mg | ORAL_TABLET | Freq: Every day | ORAL | Status: DC
  Administered 2020-11-09 – 2020-11-11 (×3): 54 mg via ORAL
  Filled 2020-11-09 (×3): qty 1

## 2020-11-09 MED ORDER — METHYLPREDNISOLONE 4 MG PO TBPK
8.0000 mg | ORAL_TABLET | Freq: Every evening | ORAL | Status: AC
  Administered 2020-11-09: 8 mg via ORAL

## 2020-11-09 MED ORDER — LOSARTAN POTASSIUM 50 MG PO TABS
100.0000 mg | ORAL_TABLET | Freq: Every day | ORAL | Status: DC
  Administered 2020-11-09 – 2020-11-11 (×3): 100 mg via ORAL
  Filled 2020-11-09 (×3): qty 2

## 2020-11-09 MED ORDER — METHYLPREDNISOLONE 4 MG PO TBPK: 8.0000 mg | ORAL_TABLET | Freq: Every evening | ORAL | Status: DC

## 2020-11-09 MED ORDER — INSULIN ASPART 100 UNIT/ML ~~LOC~~ SOLN
0.0000 [IU] | Freq: Every day | SUBCUTANEOUS | Status: DC
  Administered 2020-11-09: 2 [IU] via SUBCUTANEOUS
  Administered 2020-11-10: 4 [IU] via SUBCUTANEOUS

## 2020-11-09 MED ORDER — POLYETHYLENE GLYCOL 3350 17 G PO PACK: 17.0000 g | PACK | Freq: Every day | ORAL | Status: DC | PRN

## 2020-11-09 MED ORDER — INSULIN ASPART 100 UNIT/ML ~~LOC~~ SOLN
0.0000 [IU] | Freq: Three times a day (TID) | SUBCUTANEOUS | Status: DC
  Administered 2020-11-09 (×2): 2 [IU] via SUBCUTANEOUS

## 2020-11-09 MED ORDER — AMLODIPINE BESYLATE 10 MG PO TABS
10.0000 mg | ORAL_TABLET | Freq: Every day | ORAL | Status: DC
  Administered 2020-11-09 – 2020-11-11 (×3): 10 mg via ORAL
  Filled 2020-11-09 (×3): qty 1

## 2020-11-09 MED ORDER — ACETAMINOPHEN 325 MG PO TABS: 650.0000 mg | ORAL_TABLET | Freq: Four times a day (QID) | ORAL | Status: DC | PRN

## 2020-11-09 MED ORDER — SODIUM CHLORIDE 0.9% FLUSH
3.0000 mL | Freq: Two times a day (BID) | INTRAVENOUS | Status: DC
  Administered 2020-11-09 – 2020-11-10 (×4): 3 mL via INTRAVENOUS

## 2020-11-09 NOTE — ED Notes (Signed)
Plan of care discussed with Dr Renaye Rakers while pt continues to boarder in ED awaiting inpatient bed assignment.  Pt reports mild headache, continues with oxygen 6L humidified, Sat 97%

## 2020-11-09 NOTE — ED Notes (Signed)
ED Provider at bedside. 

## 2020-11-09 NOTE — ED Notes (Signed)
SpO2 dropping to 84% on 4L[place]. Resting comfortably. Increased to [facility]

## 2020-11-09 NOTE — ED Notes (Signed)
Pt complains this morning of headache and inability to lift bilateral legs and pain in right thigh.  Pt also had a frozen left shoulder and redness and swelling to the right hand

## 2020-11-09 NOTE — ED Notes (Signed)
Med list updated, consulted EDP regarding orders.

## 2020-11-09 NOTE — H&P (Addendum)
History and Physical  Andrea Middleton ALP:[id number] DOB: April 16, 1961 DOA: 11/07/2020  Referring physician: Derwood Kaplan, ED physician PCP: Pcp, No  Outpatient Specialists: None Patient coming from: Home & is able to ambulate without assistance  Chief Complaint: Right hand pain  Please note that patient speaks little [place] and mostly [place] and is also near deaf HPI: Andrea Middleton is a 60 y.o. female with medical history significant for being near deaf, plus hypertension and diabetes who presented to the emergency room at [facility] on the evening of 1/15 for right hand pain.  She had been in her usual state of decent health when she started having right hand pain, more on the dorsal surface nonradiating and not going past the MCP joints or wrist, but the pain began to be so severe, she decided to go to the emergency room.   ED Course: In the emergency room, patient noted to have a normal white blood cell count and no signs of cellulitis, no erythema.  CRP elevated at 11.8 and she is also noted to have some mild acute kidney injury, with creatinine at 1.27.  Uric acid level normal, so suspected that she likely has having rheumatologic process or pseudogout.  Patient was going to be sent home with some anti-inflammatories and have the patient follow-up with hand surgery, when she passed out in the emergency room.  She was tended to immediately and found to be hypoxic requiring 4 L of oxygen to get oxygen saturations above 90%.  Patient with no previous history of respiratory issues.  Chest x-ray noted left perihilar airspace opacities, and a CT angiogram of chest noted no evidence of PE although did note an incidental small right middle lobe nodule.  COVID test negative.  Lactic acid level normal.  Patient was given antibiotics and fluids for presumed pneumonia.  Hospitalist were contacted for patient to be admitted for further work-up.  Due to COVID surge currently lack of beds,  patient was not able to be transferred to the hospital until morning of 1/17.  Labs this morning revealed a BNP of 482 with procalcitonin level pending.  White count remained normal.  Renal function with fluids normalized but hemoglobin dropped from 10.3 down to 8.5.  Review of Systems: Patient seen after arrival to floor. Pt complains of mild shortness of breath although better with oxygen, continued mild R hand pain and also some R ear discomfort.  She also complains of some right leg pain starting at her hip going down to her foot on the lateral aspect of the leg  Pt denies any headaches, vision changes, dysphagia, chest pain, palpitations, abdominal pain, hematuria, dysuria, constipation, diarrhea, focal extremity numbness or weakness.  Review of systems are otherwise negative   Past Medical History:  Diagnosis Date  . Deaf   . Diabetes mellitus without complication (HCC)   . Hypercholesteremia   . Hypertension    History reviewed. No pertinent surgical history.  Social History:  reports that she has never smoked. She has never used smokeless tobacco. She reports previous alcohol use. She reports previous drug use.  Lives at home with her son.  Ambulates usually without assistance   No Known Allergies  Family history: Diabetes  Prior to Admission medications   Medication Sig Start Date End Date Taking? Authorizing Provider  amLODipine (NORVASC) 10 MG tablet Take 10 mg by mouth daily. 10/26/20  Yes [provider]  ASPIRIN LOW DOSE 81 MG EC tablet Take 81 mg by mouth daily.  10/26/20  Yes [provider]  atorvastatin (LIPITOR) 40 MG tablet Take 40 mg by mouth at bedtime. 10/26/20  Yes [provider]  carvedilol (COREG) 25 MG tablet Take 25 mg by mouth 2 (two) times daily. 10/26/20  Yes [provider]  cephALEXin (KEFLEX) 500 MG capsule Take 1 capsule (500 mg total) by mouth 4 (four) times daily for 7 days. 11/07/20 11/14/20 Yes Maxwell Caul, PA-C   fenofibrate (TRICOR) 48 MG tablet Take 48 mg by mouth daily.   Yes [provider]  glipiZIDE (GLUCOTROL) 10 MG tablet Take 5 mg by mouth 2 (two) times daily before a meal. 05/28/19  Yes [provider]  hydrochlorothiazide (HYDRODIURIL) 25 MG tablet Take 25 mg by mouth 2 (two) times daily.   Yes [provider]  HYDROcodone-acetaminophen (NORCO/VICODIN) 5-325 MG tablet Take 1-2 tablets by mouth every 6 (six) hours as needed. 11/07/20  Yes Graciella Freer A, PA-C  insulin glargine (LANTUS) 100 unit/mL SOPN Inject 10 Units into the skin 2 (two) times daily.   Yes [provider]  losartan (COZAAR) 100 MG tablet Take 100 mg by mouth daily. 10/26/20  Yes [provider]  metFORMIN (GLUCOPHAGE) 1000 MG tablet Take 1,000 mg by mouth 2 (two) times daily with a meal.   Yes [provider]  Multiple Vitamin (MULTIVITAMIN WITH MINERALS) TABS tablet Take 1 tablet by mouth daily.   Yes [provider]  naproxen (NAPROSYN) 375 MG tablet Take 1 tablet (375 mg total) by mouth 2 (two) times daily for 5 days. 11/07/20 11/12/20 Yes Graciella Freer A, PA-C  ondansetron (ZOFRAN ODT) 4 MG disintegrating tablet Take 1 tablet (4 mg total) by mouth every 8 (eight) hours as needed for nausea or vomiting. 11/07/20  Yes Maxwell Caul, PA-C    Physical Exam: BP (!) 167/72 (BP Location: Right Arm)   Pulse 95   Temp 99.1 F (37.3 C) (Oral)   Resp 16   Ht 5\' 3"  (1.6 m)   Wt 71.9 kg   LMP  (LMP Unknown)   SpO2 (!) 89% Comment: nurse notified  BMI 28.08 kg/m   . General: Alert and oriented x2 it appears, fatigued, no acute distress . Eyes: Sclera nonicteric, extraocular movements are intact . ENT: Normocephalic and atraumatic, mucous membranes are slightly dry.  Using otoscope, right ear impacted with cerumen.  Left ear less . Neck: Supple, no JVD . Cardiovascular: Regular rate and rhythm, S1-S2, 2 out of 6 holosystolic murmur . Respiratory: Decreased breath  sounds bibasilar . Abdomen: Soft, nontender, nondistended, hypoactive bowel sounds . Skin: No skin breaks, tears or lesions . Musculoskeletal: No clubbing or cyanosis or edema.  Right hand is mildly tender and minimally swollen, no erythema . Psychiatric: Appropriate, no evidence of psychoses . Neurologic: No focal deficits          Labs on Admission:  Basic Metabolic Panel: Recent Labs  Lab 11/07/20 1840 11/08/20 0334 11/09/20 0757  NA 134* 134* 137  K 4.0 4.7 3.7  CL 94* 101 99  CO2 29 25 28   GLUCOSE 161* 212* 108*  BUN 29* 32* 23*  CREATININE 1.27* 1.28* 1.00  CALCIUM 9.3 7.9* 8.8*   Liver Function Tests: Recent Labs  Lab 11/09/20 1303  AST 20  ALT 17  ALKPHOS 44  BILITOT 0.5  PROT 6.7  ALBUMIN 3.2*   No results for input(s): LIPASE, AMYLASE in the last 168 hours. No results for input(s): AMMONIA in the last 168 hours.  CBC: Recent Labs  Lab 11/07/20 1840 11/09/20 0757  WBC 10.4 9.3  NEUTROABS 7.4 7.4  HGB 10.3* 8.5*  HCT 32.6* 27.4*  MCV 78.6* 79.7*  PLT 327 260   Cardiac Enzymes: No results for input(s): CKTOTAL, CKMB, CKMBINDEX, TROPONINI in the last 168 hours.  BNP (last 3 results) Recent Labs    11/09/20 0758  BNP 482.8*    ProBNP (last 3 results) No results for input(s): PROBNP in the last 8760 hours.  CBG: Recent Labs  Lab 11/09/20 0050 11/09/20 0813 11/09/20 0946 11/09/20 1302  GLUCAP 88 106* 85 143*    Radiological Exams on Admission: CT Angio Chest PE W and/or Wo Contrast  Result Date: 11/07/2020 CLINICAL DATA:  Low O2 sats EXAM: CT ANGIOGRAPHY CHEST WITH CONTRAST TECHNIQUE: Multidetector CT imaging of the chest was performed using the standard protocol during bolus administration of intravenous contrast. Multiplanar CT image reconstructions and MIPs were obtained to evaluate the vascular anatomy. CONTRAST:  75mL OMNIPAQUE IOHEXOL 350 MG/ML SOLN COMPARISON:  None. FINDINGS: Cardiovascular: There is a optimal opacification of the  pulmonary arteries. There is no central,segmental, or subsegmental filling defects within the pulmonary arteries. The heart is normal in size. No pericardial effusion or thickening. No evidence right heart strain. There is normal three-vessel brachiocephalic anatomy without proximal stenosis. The thoracic aorta is normal in appearance. Mediastinum/Nodes: No hilar, mediastinal, or axillary adenopathy. Thyroid gland, trachea, and esophagus demonstrate no significant findings. Lungs/Pleura: Hazy airspace opacity seen at the periphery of the periphery of the posterior left lung base. There is a small 4 mm nodule seen within the anterior right middle lobe. No pleural effusion or pneumothorax is noted. Upper Abdomen: No acute abnormalities present in the visualized portions of the upper abdomen. Musculoskeletal: No chest wall abnormality. There is an 8 mm round sclerotic lesion seen within the T10 vertebral body. Review of the MIP images confirms the above findings. IMPRESSION: No central, segmental, or subsegmental pulmonary embolism. Mildly hazy airspace opacity within the posterior left lung base which could be due to atelectasis and/or early infectious etiology. 4 mm pulmonary nodule within the right middle lobe. No follow-up needed if patient is low-risk. Non-contrast chest CT can be considered in 12 months if patient is high-risk. This recommendation follows the consensus statement: Guidelines for Management of Incidental Pulmonary Nodules Detected on CT Images: From the Fleischner Society 2017; Radiology 2017; 284:228-243. Electronically Signed   By: Jonna ClarkBindu  Avutu M.D.   On: 11/07/2020 23:17   DG Chest Portable 1 View  Result Date: 11/07/2020 CLINICAL DATA:  Low O2 sats EXAM: PORTABLE CHEST 1 VIEW COMPARISON:  None. FINDINGS: The heart size and mediastinal contours are within normal limits. There appears to be a hazy left perihilar opacities. A somewhat nodular appearance is seen within the left perihilar region  which measures 1 cm. The visualized skeletal structures are unremarkable. IMPRESSION: Left perihilar airspace opacities and a 1 cm left perihilar nodular opacity, which is non-specific and would recommend dedicated chest CT for further evaluation. Electronically Signed   By: Jonna ClarkBindu  Avutu M.D.   On: 11/07/2020 22:21   DG Hand Complete Right  Result Date: 11/07/2020 CLINICAL DATA:  Acute onset right hand pain and swelling. No injury. EXAM: RIGHT HAND - COMPLETE 3+ VIEW COMPARISON:  None. FINDINGS: Alignment is normal. There is mild periarticular osteopenia. No bony erosions. Minimal degenerative change over the first carpometacarpal joint and scaphoid trapezium joint. No fracture or dislocation. IMPRESSION: 1. No acute findings. 2. Mild degenerative change as  described. Electronically Signed   By: Elberta Fortis M.D.   On: 11/07/2020 15:44    EKG: Independently reviewed.  Normal sinus rhythm  Assessment/Plan Present on Admission: . Acute on chronic respiratory failure with hypoxia (HCC) secondary to suspected acute diastolic heart failure: No evidence of sepsis, sepsis ruled out.  New finding as patient does not have any have any previous history of heart disease.  She did note that at times as of late, her legs have started to swell.  Have started IV Lasix and monitoring output.  Echocardiogram ordered and is pending.  Marland Kitchen Overweight (BMI 25.0-29.9): Meets criteria BMI greater than 25  . Essential hypertension: Elevated blood pressures, especially with additional volume overload from fluid.  Should improve as she is diuresed.  . Controlled type 2 diabetes mellitus with hyperglycemia [facility]): Checking A1c, continue home medication and sliding scale.  Marland Kitchen Hyperlipidemia: Continue statin.  . Anemia: Unclear etiology.  No previous history of bleeding.  This could be chronic disease although her renal function is good.  Suspect that she came in a little hemoconcentrated, likely dry following pseudogout  flare.  Need to monitor closely to ensure that she does not have any further bleeding although blood pressures are elevated not low.  Marland Kitchen AKI (acute kidney injury) [facility]): Improved although we will watch with Lasix.  Initially likely secondary to being dehydrated from pseudogout.  . Pseudogout of hand, right: We will try some prednisone.  Uric acid level normal.  CRP elevated likely in the setting of this flare.  She has a previous history of a left frozen shoulder, so suspect a rheumatologic process.  . Right middle lobe pulmonary nodule: Incidentally noted, repeat CT in 6 months.  Patient does not smoke.  . Right ear impacted cerumen: Complaining of some ear pain although it was a little vague whether she was having pain inside her ear versus the oxygen line has become tangled with her cardiac monitor and pulling leading to some right ear discomfort.  Noted to have significant amount of earwax and not able to visualize the eardrum very well.  We will try to clean out later  Principal Problem:   Acute on chronic respiratory failure with hypoxia (HCC) Active Problems:   Severe sepsis (HCC)   Overweight (BMI 25.0-29.9)   Essential hypertension   Controlled type 2 diabetes mellitus with hyperglycemia (HCC)   Hyperlipidemia   Anemia   AKI (acute kidney injury) (HCC)   Pseudogout of hand, right   Acute diastolic CHF (congestive heart failure) (HCC)   Right middle lobe pulmonary nodule   Right ear impacted cerumen   DVT prophylaxis: Lovenox  Code Status: Full code  Family Communication: Son at the bedside  Disposition Plan: Here for several days as we diurese, improve her oxygenation  Consults called: None  Admission status: Given need for acute hospital services past 2 midnights, admit as inpatient  COVID status: Patient does not have COVID.  Vaccination status unconfirmed  Hollice Espy MD [facility] Pager [phone]  If 7PM-7AM, please contact  night-coverage [email or web address] Password [facility]  11/09/2020, 3:16 PM

## 2020-11-09 NOTE — Progress Notes (Signed)
  Echocardiogram 2D Echocardiogram has been performed.  Janalyn Harder 11/09/2020, 3:21 PM

## 2020-11-09 NOTE — ED Notes (Signed)
Attempted to give report to the floor. Nurse unavailable at this time

## 2020-11-10 ENCOUNTER — Encounter (HOSPITAL_COMMUNITY): Payer: Self-pay | Admitting: Internal Medicine

## 2020-11-10 DIAGNOSIS — I370 Nonrheumatic pulmonary valve stenosis: Secondary | ICD-10-CM

## 2020-11-10 DIAGNOSIS — J9621 Acute and chronic respiratory failure with hypoxia: Principal | ICD-10-CM

## 2020-11-10 LAB — CBC
HCT: 29.9 % — ABNORMAL LOW (ref 36.0–46.0)
Hemoglobin: 9.4 g/dL — ABNORMAL LOW (ref 12.0–15.0)
MCH: 25.1 pg — ABNORMAL LOW (ref 26.0–34.0)
MCHC: 31.4 g/dL (ref 30.0–36.0)
MCV: 79.7 fL — ABNORMAL LOW (ref 80.0–100.0)
Platelets: 282 10*3/uL (ref 150–400)
RBC: 3.75 MIL/uL — ABNORMAL LOW (ref 3.87–5.11)
RDW: 15.5 % (ref 11.5–15.5)
WBC: 7.8 10*3/uL (ref 4.0–10.5)
nRBC: 0 % (ref 0.0–0.2)

## 2020-11-10 LAB — COMPREHENSIVE METABOLIC PANEL
ALT: 21 U/L (ref 0–44)
AST: 21 U/L (ref 15–41)
Albumin: 3.3 g/dL — ABNORMAL LOW (ref 3.5–5.0)
Alkaline Phosphatase: 46 U/L (ref 38–126)
Anion gap: 12 (ref 5–15)
BUN: 23 mg/dL — ABNORMAL HIGH (ref 6–20)
CO2: 27 mmol/L (ref 22–32)
Calcium: 9 mg/dL (ref 8.9–10.3)
Chloride: 96 mmol/L — ABNORMAL LOW (ref 98–111)
Creatinine, Ser: 0.98 mg/dL (ref 0.44–1.00)
GFR, Estimated: >60 mL/min (ref >60)
Glucose, Bld: 257 mg/dL — ABNORMAL HIGH (ref 70–99)
Potassium: 4.3 mmol/L (ref 3.5–5.1)
Sodium: 135 mmol/L (ref 135–145)
Total Bilirubin: 0.6 mg/dL (ref 0.3–1.2)
Total Protein: 7 g/dL (ref 6.5–8.1)

## 2020-11-10 LAB — GLUCOSE, CAPILLARY
Glucose-Capillary: 417 mg/dL — ABNORMAL HIGH (ref 70–99)
Glucose-Capillary: 374 mg/dL — ABNORMAL HIGH (ref 70–99)
Glucose-Capillary: 317 mg/dL — ABNORMAL HIGH (ref 70–99)

## 2020-11-10 MED ORDER — INSULIN ASPART 100 UNIT/ML ~~LOC~~ SOLN: 0.0000 [IU] | Freq: Three times a day (TID) | SUBCUTANEOUS | Status: DC

## 2020-11-10 MED ORDER — OXYCODONE-ACETAMINOPHEN 5-325 MG PO TABS: 1.0000 | ORAL_TABLET | Freq: Four times a day (QID) | ORAL | Status: DC | PRN

## 2020-11-10 MED ORDER — INSULIN ASPART 100 UNIT/ML ~~LOC~~ SOLN
0.0000 [IU] | Freq: Three times a day (TID) | SUBCUTANEOUS | Status: DC
  Administered 2020-11-10 (×2): 15 [IU] via SUBCUTANEOUS
  Administered 2020-11-11: 5 [IU] via SUBCUTANEOUS

## 2020-11-10 MED ORDER — INSULIN ASPART 100 UNIT/ML ~~LOC~~ SOLN: 5.0000 [IU] | Freq: Once | SUBCUTANEOUS | Status: DC

## 2020-11-10 MED ORDER — PREDNISONE 20 MG PO TABS
20.0000 mg | ORAL_TABLET | Freq: Every day | ORAL | Status: DC
  Administered 2020-11-11: 20 mg via ORAL
  Filled 2020-11-10: qty 1

## 2020-11-10 MED ORDER — INSULIN GLARGINE 100 UNIT/ML ~~LOC~~ SOLN
20.0000 [IU] | Freq: Every day | SUBCUTANEOUS | Status: DC
  Administered 2020-11-10 – 2020-11-11 (×2): 20 [IU] via SUBCUTANEOUS
  Filled 2020-11-10 (×2): qty 0.2

## 2020-11-10 MED ORDER — IBUPROFEN 200 MG PO TABS: 600.0000 mg | ORAL_TABLET | Freq: Four times a day (QID) | ORAL | Status: DC | PRN

## 2020-11-10 NOTE — Progress Notes (Addendum)
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  In the emergency department, her CRP was found to be elevated, elevated creatinine, normal uric acid level.  She was being discharged with anti-inflammatory medications when she passed out in the emergency department.  She was found to be hypoxic requiring 4 L of oxygen per minute.  Chest x-ray showed left perihilar air space opacities, CT angiogram did not show any evidence of PE but showed incidental right small middle lobe nodule.  COVID-negative.  BNP found to be elevated.  [facility].    Echocardiogram showed severe pulmonary stenosis, preserved left ventricular function.  Cardiology consulted today.  Assessment & Plan:   Principal Problem:   Acute on chronic respiratory failure with hypoxia (HCC) Active Problems:   Overweight (BMI 25.0-29.9)   Essential hypertension   Controlled type 2 diabetes mellitus with hyperglycemia (HCC)   Hyperlipidemia   Anemia   AKI (acute kidney injury) (HCC)   Pseudogout of hand, right   Acute diastolic CHF (congestive heart failure) (HCC)   Right middle lobe pulmonary nodule   Right ear impacted cerumen   Syncopal episode/severe pulmonary stenosis: Had a syncopal episode while being discharged from the emergency department.  After that, she became hypoxic.  Echocardiogram showed severe pulmonary stenosis.  Normal left ventricular function, no evidence of right heart failure.  No history of congenital heart disease.  Cardiology consulted. Cardiology recommended pulmonary valve replacements/valvuloplasty which is not possible at our center.  She will follow-up with [facility] Cristy [facility]ory [facility]  outpatient. Currently she is hemodynamically stable. Patient will be recommended to avoid dehydration, avoid diuretics, compression stockings to prevent venous pooling.  Acute respiratory failure with hypoxia: Currently respiratory status is stable.   BNP elevated so started on IV Lasix which has been stopped.  We will monitor her on room air CT showed mild hazy airspace opacity within the posterior left lung base which is most likely atelectasis.  Pneumonia is less likely.  Her lungs are clear on auscultation today.  No fever or leukocytosis.  Needs to avoid Lasix in order to preserve preload  Diabetes type 2: Hemoglobin A1c of 7.3.  Continue sliding scale insulin.  Monitor blood sugars takes insulin and oral medications at home  Hyperlipidemia: On statin  Microcytic anemia: Unclear etiology.  No history of bleeding.  Continue to monitor.  Currently hemoglobin stable.  Check iron studies  AKI: Resolved  Right hand pain: Unclear etiology.  No history of arthritis .  Uric acid level normal.  CRP level elevated. Hand xray showed mild periarticular osteopenia,minimal degenerative change over the first carpometacarpal joint and scaphoid trapezium joint. No fracture or dislocation. Has history of left frozen shoulder.  Follow-up with rheumatology as an outpatient if needed.  Continue pain management, supportive care.  She was started on Decadron which will be discontinued because of worsening blood sugars.  There is no swelling or pain on the right hand.  Continue ibuprofen for moderate pain and oxycodone for severe pain as needed.  Right middle lobe pulm nodule:4 mm. Incidental finding.  No follow-up recommended because she is a non-smoker.  Hypertension: Currently blood stable.  Continue monitoring.  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  Noted to have significant amount of wax in the  right ear.  She needs to follow-up with ENT as an outpatient.          DVT prophylaxis: SCD Code Status: Full code Family Communication: Son at bedside Status is: Inpatient  Remains inpatient appropriate because:Inpatient level of care appropriate due to severity of illness   Dispo: The patient is from: Home              Anticipated d/c is to: Home              Anticipated d/c date is: 1 day              Patient currently is not medically stable to d/c.     Consultants: Cardiology  Procedures:None  Antimicrobials:  Anti-infectives (From admission, onward)   Start     Dose/Rate Route Frequency Ordered Stop   11/08/20 0000  clindamycin (CLEOCIN) IVPB 600 mg  Status:  Discontinued        600 mg 100 mL/hr over 30 Minutes Intravenous  Once 11/07/20 2354 11/07/20 2357   11/08/20 0000  cefTRIAXone (ROCEPHIN) 2 g in sodium chloride 0.9 % 100 mL IVPB        2 g 200 mL/hr over 30 Minutes Intravenous  Once 11/07/20 2357 11/08/20 0052   11/07/20 2115  cephALEXin (KEFLEX) capsule 500 mg        500 mg Oral  Once 11/07/20 2108 11/07/20 2212   11/07/20 0000  cephALEXin (KEFLEX) 500 MG capsule        500 mg Oral 4 times daily 11/07/20 2110 11/14/20 2359      Subjective: Patient seen and examined the bedside this morning.  During my evaluation, she was hemodynamically stable, comfortable.  Denies any shortness of breath or chest pain or cough.  She did not have any lower extremity swelling.  Right hand pain has significantly improved and there was no swelling  Objective: Vitals:   11/09/20 1700 11/09/20 2135 11/10/20 0500 11/10/20 0615  BP: 136/72 137/61  (!) 133/58  Pulse: 92 93  90  Resp: 18 14    Temp: 98.9 F (37.2 C) 99 F (37.2 C)  98.9 F (37.2 C)  TempSrc: Oral Oral  Oral  SpO2: 94% 96%  94%  Weight:   71.7 kg   Height:        Intake/Output Summary (Last 24 hours) at 11/10/2020 [id number] Last data filed at 11/09/2020 1700 Gross per 24 hour  Intake 240 ml  Output --   Net 240 ml   Filed Weights   11/07/20 1408 11/10/20 0500  Weight: 71.9 kg 71.7 kg    Examination:  General exam: Appears calm and comfortable ,Not in distress,average built, hard of hearing HEENT:PERRL,Oral mucosa moist, Ear/Nose normal on gross exam Respiratory system: Bilateral equal air entry, normal vesicular breath sounds, no wheezes or crackles  Cardiovascular system: S1 & S2 heard, RRR. No JVD, murmurs, rubs, gallops or clicks. No pedal edema. Gastrointestinal system: Abdomen is nondistended, soft and nontender. No organomegaly or masses felt. Normal bowel sounds heard. Central nervous system: Alert and oriented. No focal neurological deficits. Extremities: No edema, no clubbing ,no cyanosis Skin: No rashes, lesions or ulcers,no icterus ,no pallor   Data Reviewed: I have personally reviewed following labs and imaging studies  CBC: Recent Labs  Lab 11/07/20 1840 11/09/20 0757 11/10/20 0601  WBC 10.4 9.3 7.8  NEUTROABS 7.4 7.4  --   HGB 10.3* 8.5* 9.4*  HCT 32.6* 27.4* 29.9*  MCV 78.6* 79.7* 79.7*  PLT 327 260 282   Basic Metabolic Panel: Recent Labs  Lab 11/07/20 1840 11/08/20 0334 11/09/20 0757 11/10/20 0601  NA 134* 134* 137 135  K 4.0 4.7 3.7 4.3  CL 94* 101 99 96*  CO2 29 25 28 27   GLUCOSE 161* 212* 108* 257*  BUN 29* 32* 23* 23*  CREATININE 1.27* 1.28* 1.00 0.98  CALCIUM 9.3 7.9* 8.8* 9.0   GFR: Estimated Creatinine Clearance: 57.9 mL/min (by C-G formula based on SCr of 0.98 mg/dL). Liver Function Tests: Recent Labs  Lab 11/09/20 1303 11/10/20 0601  AST 20 21  ALT 17 21  ALKPHOS 44 46  BILITOT 0.5 0.6  PROT 6.7 7.0  ALBUMIN 3.2* 3.3*   No results for input(s): LIPASE, AMYLASE in the last 168 hours. No results for input(s): AMMONIA in the last 168 hours. Coagulation Profile: No results for input(s): INR, PROTIME in the last 168 hours. Cardiac Enzymes: No results for input(s): CKTOTAL, CKMB, CKMBINDEX, TROPONINI in the last 168  hours. BNP (last 3 results) No results for input(s): PROBNP in the last 8760 hours. HbA1C: Recent Labs    11/09/20 0120  HGBA1C 7.3*   CBG: Recent Labs  Lab 11/09/20 0813 11/09/20 0946 11/09/20 1302 11/09/20 1715 11/09/20 2133  GLUCAP 106* 85 143* 139* 221*   Lipid Profile: No results for input(s): CHOL, HDL, LDLCALC, TRIG, CHOLHDL, LDLDIRECT in the last 72 hours. Thyroid Function Tests: No results for input(s): TSH, T4TOTAL, FREET4, T3FREE, THYROIDAB in the last 72 hours. Anemia Panel: No results for input(s): VITAMINB12, FOLATE, FERRITIN, TIBC, IRON, RETICCTPCT in the last 72 hours. Sepsis Labs: Recent Labs  Lab 11/08/20 0012 11/08/20 0155 11/09/20 1303  PROCALCITON  --   --  <0.10  LATICACIDVEN 0.3* 0.4*  --     Recent Results (from the past 240 hour(s))  Resp Panel by RT-PCR (Flu A&B, Covid) Nasopharyngeal Swab     Status: None   Collection Time: 11/07/20  9:45 PM   Specimen: Nasopharyngeal Swab; Nasopharyngeal(NP) swabs in vial transport medium  Result Value Ref Range Status   SARS Coronavirus 2 by RT PCR NEGATIVE NEGATIVE Final    Comment: (NOTE) SARS-CoV-2 target nucleic acids are NOT DETECTED.  The SARS-CoV-2 RNA is generally detectable in upper respiratory specimens during the acute phase of infection. The lowest concentration of SARS-CoV-2 viral copies this assay can detect is 138 copies/mL. A negative result does not preclude SARS-Cov-2 infection and should not be used as the sole basis for treatment or other patient management decisions. A negative result may occur with  improper specimen collection/handling, submission of specimen other than nasopharyngeal swab, presence of viral mutation(s) within the areas targeted by this assay, and inadequate number of viral copies(<138 copies/mL). A negative result must be combined with clinical observations, patient history, and epidemiological information. The expected result is Negative.  Fact Sheet for  Patients:  11/09/20  Fact Sheet for Healthcare Providers:  BloggerCourse.com  This test is no t yet approved or cleared by the [place] FDA and  has been authorized for detection and/or diagnosis of SARS-CoV-2 by FDA under an Emergency Use Authorization (EUA). This EUA will remain  in effect (meaning this test can be used) for the duration of the COVID-19 declaration under Section 564(b)(1) of the Act, 21 U.S.C.section 360bbb-3(b)(1), unless the authorization is terminated  or revoked sooner.  Influenza A by PCR NEGATIVE NEGATIVE Final   Influenza B by PCR NEGATIVE NEGATIVE Final    Comment: (NOTE) The Xpert Xpress SARS-CoV-2/FLU/RSV plus assay is intended as an aid in the diagnosis of influenza from Nasopharyngeal swab specimens and should not be used as a sole basis for treatment. Nasal washings and aspirates are unacceptable for Xpert Xpress SARS-CoV-2/FLU/RSV testing.  Fact Sheet for Patients: BloggerCourse.com  Fact Sheet for Healthcare Providers: SeriousBroker.it  This test is not yet approved or cleared by the [place] FDA and has been authorized for detection and/or diagnosis of SARS-CoV-2 by FDA under an Emergency Use Authorization (EUA). This EUA will remain in effect (meaning this test can be used) for the duration of the COVID-19 declaration under Section 564(b)(1) of the Act, 21 U.S.C. section 360bbb-3(b)(1), unless the authorization is terminated or revoked.  Performed at [facility], [address] [place], [place] [address]   Blood culture (routine x 2)     Status: None (Preliminary result)   Collection Time: 11/08/20 12:10 AM   Specimen: BLOOD  Result Value Ref Range Status   Specimen Description   Final    BLOOD LEFT ANTECUBITAL Performed at [facility], [address] [place], [place] [address]     Special Requests   Final    BOTTLES DRAWN AEROBIC AND ANAEROBIC Blood Culture adequate volume Performed at [facility], [address] [place], [place] [address]    Culture   Final    NO GROWTH < 24 HOURS Performed at [facility], [address], [place], [place] [address]    Report Status PENDING  Incomplete  Blood culture (routine x 2)     Status: None (Preliminary result)   Collection Time: 11/08/20 12:12 AM   Specimen: BLOOD LEFT HAND  Result Value Ref Range Status   Specimen Description   Final    BLOOD LEFT HAND Performed at [facility], [address] [place], [place] [address]    Special Requests   Final    BOTTLES DRAWN AEROBIC AND ANAEROBIC Blood Culture results may not be optimal due to an inadequate volume of blood received in culture bottles Performed at [facility], [address] [place], [place] [address]    Culture   Final    NO GROWTH < 24 HOURS Performed at [facility], [address] [place], [place] [address]    Report Status PENDING  Incomplete         Radiology Studies: ECHOCARDIOGRAM COMPLETE  Result Date: 11/09/2020    ECHOCARDIOGRAM REPORT   Patient Name:   NALLELI LARGENT Date of Exam: 11/09/2020 Medical Rec #:  [id number]        Height:       63.0 in Accession #:    [id number]       Weight:       158.5 lb Date of Birth:  1961/09/02       BSA:          1.752 m Patient Age:    59 years         BP:           145/79 mmHg Patient Gender: F                HR:           86 bpm. Exam Location:  Inpatient Procedure: 2D Echo, 3D Echo, Cardiac  Doppler and Color Doppler Indications:    I50.40* Unspecified combined systolic (congestive) and diastolic                 (congestive) heart failure  History:        Patient has no prior history of Echocardiogram examinations.                 CHF, Signs/Symptoms:Shortness of Breath and Dyspnea; Risk                 Factors:Diabetes and Hypertension. Hypoxia.  Sonographer:     Sheralyn Boatman RDCS Referring Phys: [id number] SENDIL K [facility]  Sonographer Comments: Technically difficult study due to poor echo windows. Image acquisition challenging due to patient body habitus and Restricted mobility. Patient could not stay in left decubitus position due to frozen left shoulder. Patient sensitive to pressure from probe. Ended exam at one attempt at [facility] due to patient discomfort and coughing. IMPRESSIONS  1. The pulmonic valve was abnormal. Severe pulmonic stenosis. Peak velocity 4 m/s, mean gradient 32 mmHg. Turbulent color flow at the level of the valve into main pulmonary artery. The valve is incompletely visualized. Consider cardiology consultation and further imaging to better assess RVOT, PV and PA.  2. Left ventricular ejection fraction, by estimation, is 55 to 60%. The left ventricle has normal function. The left ventricle has no regional wall motion abnormalities. There is mild left ventricular hypertrophy. Left ventricular diastolic function could not be evaluated.  3. Right ventricular systolic function is normal. The right ventricular size is normal. Tricuspid regurgitation signal is inadequate for assessing PA pressure.  4. The mitral valve is normal in structure. Trivial mitral valve regurgitation. No evidence of mitral stenosis.  5. The aortic valve is tricuspid. Aortic valve regurgitation is not visualized. No aortic stenosis is present.  6. The inferior vena cava is dilated in size with >50% respiratory variability, suggesting right atrial pressure of 8 mmHg. FINDINGS  Left Ventricle: Left ventricular ejection fraction, by estimation, is 55 to 60%. The left ventricle has normal function. The left ventricle has no regional wall motion abnormalities. The left ventricular internal cavity size was normal in size. There is  mild left ventricular hypertrophy. Left ventricular diastolic function could not be evaluated due to nondiagnostic images. Left ventricular diastolic function could  not be evaluated. Right Ventricle: The right ventricular size is normal. No increase in right ventricular wall thickness. Right ventricular systolic function is normal. Tricuspid regurgitation signal is inadequate for assessing PA pressure. Left Atrium: Left atrial size was normal in size. Right Atrium: Right atrial size was normal in size. Pericardium: There is no evidence of pericardial effusion. Presence of pericardial fat pad. Mitral Valve: The mitral valve is normal in structure. Trivial mitral valve regurgitation. No evidence of mitral valve stenosis. Tricuspid Valve: The tricuspid valve is normal in structure. Tricuspid valve regurgitation is trivial. No evidence of tricuspid stenosis. Aortic Valve: The aortic valve is tricuspid. Aortic valve regurgitation is not visualized. No aortic stenosis is present. Pulmonic Valve: The pulmonic valve was abnormal. Pulmonic valve regurgitation is trivial. Severe pulmonic stenosis. Aorta: The aortic root is normal in size and structure. Venous: The inferior vena cava is dilated in size with greater than 50% respiratory variability, suggesting right atrial pressure of 8 mmHg. IAS/Shunts: No atrial level shunt detected by color flow Doppler.  LEFT VENTRICLE PLAX 2D LVIDd:         4.20 cm     Diastology LVIDs:  2.80 cm     LV e' medial:    6.09 cm/s LV PW:         1.00 cm     LV E/e' medial:  16.7 LV IVS:        1.30 cm     LV e' lateral:   9.36 cm/s LVOT diam:     1.70 cm     LV E/e' lateral: 10.9 LV SV:         52 LV SV Index:   30 LVOT Area:     2.27 cm  LV Volumes (MOD) LV vol d, MOD A4C: 52.8 ml LV vol s, MOD A4C: 23.4 ml LV SV MOD A4C:     52.8 ml RIGHT VENTRICLE             IVC RV S prime:     16.10 cm/s  IVC diam: 2.30 cm RVOT diam:      2.10 cm TAPSE (M-mode): 1.8 cm LEFT ATRIUM             Index       RIGHT ATRIUM           Index LA diam:        3.20 cm 1.83 cm/m  RA Area:     11.50 cm LA Vol (A2C):   30.0 ml 17.13 ml/m RA Volume:   24.70 ml  14.10 ml/m  LA Vol (A4C):   43.3 ml 24.72 ml/m LA Biplane Vol: 36.9 ml 21.07 ml/m  AORTIC VALVE             PULMONIC VALVE LVOT Vmax:   124.00 cm/s PV Vmax:          3.94 m/s LVOT Vmean:  76.300 cm/s PV Vmean:         254.000 cm/s LVOT VTI:    0.230 m     PV VTI:           0.916 m                          PV Peak grad:     61.9 mmHg AORTA                    PV Mean grad:     31.0 mmHg Ao Root diam: 2.90 cm    PR End Diast Vel: 2.16 msec Ao Asc diam:  2.40 cm  MITRAL VALVE MV Area (PHT): 3.21 cm     SHUNTS MV Decel Time: 236 msec     Systemic VTI:  0.23 m MV E velocity: 102.00 cm/s  Systemic Diam: 1.70 cm MV A velocity: 105.00 cm/s  Pulmonic Diam: 2.10 cm MV E/A ratio:  0.97 Weston Brass MD Electronically signed by Weston Brass MD Signature Date/Time: 11/09/2020/11:28:31 PM    Final         Scheduled Meds: . amLODipine  10 mg Oral Daily  . aspirin EC  81 mg Oral Daily  . atorvastatin  40 mg Oral QHS  . carvedilol  25 mg Oral BID  . fenofibrate  54 mg Oral Daily  . furosemide  20 mg Intravenous BID  . insulin aspart  0-15 Units Subcutaneous TID WC  . insulin aspart  0-5 Units Subcutaneous QHS  . losartan  100 mg Oral Daily  . methylPREDNISolone  4 mg Oral 3 x daily with food  . [START ON 11/11/2020] methylPREDNISolone  4 mg Oral 4X daily taper  .  methylPREDNISolone  8 mg Oral Nightly  . sodium chloride flush  3 mL Intravenous Q12H   Continuous Infusions:   LOS: 1 day    Time spent:35 mins. More than 50% of that time was spent in counseling and/or coordination of care.      Burnadette Pop, MD [facility] P1/18/2022, 8:37 AM

## 2020-11-10 NOTE — Consult Note (Addendum)
Cardiology Consultation:   Patient ID: Solstice Lastinger MRN: [id number]; DOB: 1961/10/13  Admit date: 11/07/2020 Date of Consult: 11/10/2020  Primary Care Provider: Oneita Hurt No [facility] Cardiologist: Meriam Sprague, MD new) [facility] Electrophysiologist:  None    Patient Profile:   Renisha Cockrum is a 60 y.o. 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 Renford Dills.  History of Present Illness:   Ms. Mabry has no known cardiac history. She is originally from [place] and moved here in 2018. She is accompanied by her son Allene Pyo and Caffie Pinto today. They have had to assist with translation as the patient is nearly deaf and therefore the video translation service does not work well. She is able to read their lips. Per our discussion, at baseline she has chronic DOE with higher levels of exertion like steps but there have not been any recent changes in this. No prior chest pain, orthopnea or syncope. Ever since she came to the [place], she has noticed trace intermittent edema that is typically improved with compression hose but it has never been a major problem for her.   She has had some recent joint pain issues with right hand pain/swelling and left shoulder pain. Her right hand pain became more severe so she presented to the [facility] ED on 1/15 for evaluation. Workup was notable for elevated CRP, mild AKI, anemia with Hgb of 8.5, and normal uric acid level. Rheumatologic or pseudogout process was suspected. She was given hydrocodone and going to be discharged with anti-inflammatories. However, while the patient was getting repeat vital signs done prior to discharge, her O2 sats were noted to be dropping down into the 70s at times. Initially it was thought perhaps this could be due to the hydrocodone but then the patient had an episode where she became lethargic, vomiting, diaphoretic, and O2 dropped  into the 50s with hypotension. EDP was at bedside so she was given Narcan, fluids and O2 administered. CXR showed left perihilar airspace opacities. Covid negative. CT angio showed no PE but did note incidental small RML nodule. She was started on antibiotics for possible PNA. She was subsequently [facility]ferred to [facility] for [facility] management. Labs notable for initial Cr 1.28 (repeat now 0.98), troponin negative x1, BNP 482, Hgb 8.5 (microcytic) with repeat 9.4 an no prior for comparison, A1C 7.5, albumin 3.2, procalcitonin negative. Upon arrival she was placed on low dose IV Lasix 20mg  BID in case of possible CHF contributing.  2D echo yesterday showed EF 55-60% with severe pulmonic stenosis with peak velocity 48m/s and mean gradient , mild LVH, RV normal, dilated IVC. She is currently feeling better, stable on O2.   Past Medical History:  Diagnosis Date  . Deaf   . Diabetes mellitus without complication (HCC)   . Hypercholesteremia   . Hypertension     Past Surgical History:  Procedure Laterality Date  . None       Home Medications:  Prior to Admission medications   Medication Sig Start Date End Date Taking? Authorizing Provider  amLODipine (NORVASC) 10 MG tablet Take 10 mg by mouth daily. 10/26/20  Yes [provider]  ASPIRIN LOW DOSE 81 MG EC tablet Take 81 mg by mouth daily. 10/26/20  Yes [provider]  atorvastatin (LIPITOR) 40 MG tablet Take 40 mg by mouth at bedtime. 10/26/20  Yes [provider]  carvedilol (COREG) 25 MG tablet Take 25 mg  by mouth 2 (two) times daily. 10/26/20  Yes [provider]  cephALEXin (KEFLEX) 500 MG capsule Take 1 capsule (500 mg total) by mouth 4 (four) times daily for 7 days. 11/07/20 11/14/20 Yes Maxwell Caul, PA-C  fenofibrate (TRICOR) 48 MG tablet Take 48 mg by mouth daily.   Yes [provider]  glipiZIDE (GLUCOTROL) 10 MG tablet Take 5 mg by mouth 2 (two) times daily before a meal. 05/28/19  Yes [provider]  hydrochlorothiazide (HYDRODIURIL) 25 MG tablet Take 25 mg by mouth 2 (two) times daily.   Yes [provider]  HYDROcodone-acetaminophen (NORCO/VICODIN) 5-325 MG tablet Take 1-2 tablets by mouth every 6 (six) hours as needed. 11/07/20  Yes Graciella Freer A, PA-C  insulin glargine (LANTUS) 100 unit/mL SOPN Inject 10 Units into the skin 2 (two) times daily.   Yes [provider]  losartan (COZAAR) 100 MG tablet Take 100 mg by mouth daily. 10/26/20  Yes [provider]  metFORMIN (GLUCOPHAGE) 1000 MG tablet Take 1,000 mg by mouth 2 (two) times daily with a meal.   Yes [provider]  Multiple Vitamin (MULTIVITAMIN WITH MINERALS) TABS tablet Take 1 tablet by mouth daily.   Yes [provider]  naproxen (NAPROSYN) 375 MG tablet Take 1 tablet (375 mg total) by mouth 2 (two) times daily for 5 days. 11/07/20 11/12/20 Yes Graciella Freer A, PA-C  ondansetron (ZOFRAN ODT) 4 MG disintegrating tablet Take 1 tablet (4 mg total) by mouth every 8 (eight) hours as needed for nausea or vomiting. 11/07/20  Yes Maxwell Caul, PA-C    Inpatient Medications: Scheduled Meds: . amLODipine  10 mg Oral Daily  . aspirin EC  81 mg Oral Daily  . atorvastatin  40 mg Oral QHS  . carvedilol  25 mg Oral BID  . fenofibrate  54 mg Oral Daily  . insulin aspart  0-15 Units Subcutaneous TID WC  . insulin aspart  0-5 Units Subcutaneous QHS  . losartan  100 mg Oral Daily  . methylPREDNISolone  4 mg Oral 3 x daily with food  . [START ON 11/11/2020] methylPREDNISolone  4 mg Oral 4X daily taper  . methylPREDNISolone  8 mg Oral Nightly  . sodium chloride flush  3 mL Intravenous Q12H   Continuous Infusions:  PRN Meds: acetaminophen **OR** acetaminophen, albuterol, ondansetron **OR** ondansetron (ZOFRAN) IV, polyethylene glycol  Allergies:   No Known Allergies  Social History:   Social History   Socioeconomic History  . Marital status: Married    Spouse name: Not  on file  . Number of children: Not on file  . Years of education: Not on file  . Highest education level: Not on file  Occupational History  . Not on file  Tobacco Use  . Smoking status: Never Smoker  . Smokeless tobacco: Never Used  Substance and Sexual Activity  . Alcohol use: Not Currently  . Drug use: Never  . Sexual activity: Not on file  Other Topics Concern  . Not on file  Social History Narrative  . Not on file   Social Determinants of Health   Financial Resource Strain: Not on file  Food Insecurity: Not on file  Transportation Needs: Not on file  Physical Activity: Not on file  Stress: Not on file  Social Connections: Not on file  Intimate Partner Violence: Not on file    Family History:   Family History  Problem Relation Age of Onset  . Diabetes Mother  ROS:  Please see the history of present illness.  All other ROS reviewed and negative.     Physical Exam/Data:   Vitals:   11/09/20 1700 11/09/20 2135 11/10/20 0500 11/10/20 0615  BP: 136/72 137/61  (!) 133/58  Pulse: 92 93  90  Resp: 18 14    Temp: 98.9 F (37.2 C) 99 F (37.2 C)  98.9 F (37.2 C)  TempSrc: Oral Oral  Oral  SpO2: 94% 96%  94%  Weight:   71.7 kg   Height:        Intake/Output Summary (Last 24 hours) at 11/10/2020 1027 Last data filed at 11/09/2020 1700 Gross per 24 hour  Intake 240 ml  Output -  Net 240 ml   Last 3 Weights 11/10/2020 11/07/2020  Weight (lbs) 158 lb 1.1 oz 158 lb 8 oz  Weight (kg) 71.7 kg 71.895 kg     Body mass index is 28 kg/m.  General: Well developed, well nourished [place] female in no acute distress. Head: Normocephalic, atraumatic, sclera non-icteric, no xanthomas, nares are without discharge. Neck: Negative for carotid bruits. JVP not elevated. Lungs: Clear bilaterally to auscultation without wheezes, rales, or rhonchi. Breathing is unlabored. Heart: RRR with SEM heard over entire precordium but best at LUSB, no clicks, rubs or gallops.   Abdomen: Soft, non-tender, non-distended with normoactive bowel sounds. No rebound/guarding. Extremities: No clubbing or cyanosis. No edema. Distal pedal pulses are 2+ and equal bilaterally. Neuro: Alert and oriented X 3 with questioning by family. Moves all extremities spontaneously. Psych:  Normal affect  EKG:  The EKG was personally reviewed and demonstrates:  NSR 69bpm without acute STT changes, nonspecific TWI avL - generally benign  Telemetry:  Telemetry was personally reviewed and demonstrates:  NSR  Relevant CV Studies:  2D echo 11/09/20 1. The pulmonic valve was abnormal. Severe pulmonic stenosis. Peak  velocity 4 m/s, mean gradient 32 mmHg. Turbulent color flow at the level  of the valve into main pulmonary artery. The valve is incompletely  visualized. Consider cardiology consultation  and further imaging to better assess RVOT, PV and PA.  2. Left ventricular ejection fraction, by estimation, is 55 to 60%. The  left ventricle has normal function. The left ventricle has no regional  wall motion abnormalities. There is mild left ventricular hypertrophy.  Left ventricular diastolic function  could not be evaluated.  3. Right ventricular systolic function is normal. The right ventricular  size is normal. Tricuspid regurgitation signal is inadequate for assessing  PA pressure.  4. The mitral valve is normal in structure. Trivial mitral valve  regurgitation. No evidence of mitral stenosis.  5. The aortic valve is tricuspid. Aortic valve regurgitation is not  visualized. No aortic stenosis is present.  6. The inferior vena cava is dilated in size with >50% respiratory  variability, suggesting right atrial pressure of 8 mmHg.   Laboratory Data:  High Sensitivity Troponin:   Recent Labs  Lab 11/07/20 2302 11/08/20 0012  TROPONINIHS 5 5     Chemistry Recent Labs  Lab 11/08/20 0334 11/09/20 0757 11/10/20 0601  NA 134* 137 135  K 4.7 3.7 4.3  CL 101 99 96*  CO2  25 28 27   GLUCOSE 212* 108* 257*  BUN 32* 23* 23*  CREATININE 1.28* 1.00 0.98  CALCIUM 7.9* 8.8* 9.0  GFRNONAA 48* >60 >60  ANIONGAP 8 10 12     Recent Labs  Lab 11/09/20 1303 11/10/20 0601  PROT 6.7 7.0  ALBUMIN 3.2* 3.3*  AST  20 21  ALT 17 21  ALKPHOS 44 46  BILITOT 0.5 0.6   Hematology Recent Labs  Lab 11/07/20 1840 11/09/20 0757 11/10/20 0601  WBC 10.4 9.3 7.8  RBC 4.15 3.44* 3.75*  HGB 10.3* 8.5* 9.4*  HCT 32.6* 27.4* 29.9*  MCV 78.6* 79.7* 79.7*  MCH 24.8* 24.7* 25.1*  MCHC 31.6 31.0 31.4  RDW 15.8* 15.7* 15.5  PLT 327 260 282   BNP Recent Labs  Lab 11/09/20 0758  BNP 482.8*    DDimer No results for input(s): DDIMER in the last 168 hours.   Radiology/Studies:  CT Angio Chest PE W and/or Wo Contrast  Result Date: 11/07/2020 CLINICAL DATA:  Low O2 sats EXAM: CT ANGIOGRAPHY CHEST WITH CONTRAST TECHNIQUE: Multidetector CT imaging of the chest was performed using the standard protocol during bolus administration of intravenous contrast. Multiplanar CT image reconstructions and MIPs were obtained to evaluate the vascular anatomy. CONTRAST:  75mL OMNIPAQUE IOHEXOL 350 MG/ML SOLN COMPARISON:  None. FINDINGS: Cardiovascular: There is a optimal opacification of the pulmonary arteries. There is no central,segmental, or subsegmental filling defects within the pulmonary arteries. The heart is normal in size. No pericardial effusion or thickening. No evidence right heart strain. There is normal three-vessel brachiocephalic anatomy without proximal stenosis. The thoracic aorta is normal in appearance. Mediastinum/Nodes: No hilar, mediastinal, or axillary adenopathy. Thyroid gland, trachea, and esophagus demonstrate no significant findings. Lungs/Pleura: Hazy airspace opacity seen at the periphery of the periphery of the posterior left lung base. There is a small 4 mm nodule seen within the anterior right middle lobe. No pleural effusion or pneumothorax is noted. Upper Abdomen: No  acute abnormalities present in the visualized portions of the upper abdomen. Musculoskeletal: No chest wall abnormality. There is an 8 mm round sclerotic lesion seen within the T10 vertebral body. Review of the MIP images confirms the above findings. IMPRESSION: No central, segmental, or subsegmental pulmonary embolism. Mildly hazy airspace opacity within the posterior left lung base which could be due to atelectasis and/or early infectious etiology. 4 mm pulmonary nodule within the right middle lobe. No follow-up needed if patient is low-risk. Non-contrast chest CT can be considered in 12 months if patient is high-risk. This recommendation follows the consensus statement: Guidelines for Management of Incidental Pulmonary Nodules Detected on CT Images: From the Fleischner Society 2017; Radiology 2017; 284:228-243. Electronically Signed   By: Jonna ClarkBindu  Avutu M.D.   On: 11/07/2020 23:17   DG Chest Portable 1 View  Result Date: 11/07/2020 CLINICAL DATA:  Low O2 sats EXAM: PORTABLE CHEST 1 VIEW COMPARISON:  None. FINDINGS: The heart size and mediastinal contours are within normal limits. There appears to be a hazy left perihilar opacities. A somewhat nodular appearance is seen within the left perihilar region which measures 1 cm. The visualized skeletal structures are unremarkable. IMPRESSION: Left perihilar airspace opacities and a 1 cm left perihilar nodular opacity, which is non-specific and would recommend dedicated chest CT for further evaluation. Electronically Signed   By: Jonna ClarkBindu  Avutu M.D.   On: 11/07/2020 22:21   DG Hand Complete Right  Result Date: 11/07/2020 CLINICAL DATA:  Acute onset right hand pain and swelling. No injury. EXAM: RIGHT HAND - COMPLETE 3+ VIEW COMPARISON:  None. FINDINGS: Alignment is normal. There is mild periarticular osteopenia. No bony erosions. Minimal degenerative change over the first carpometacarpal joint and scaphoid trapezium joint. No fracture or dislocation. IMPRESSION: 1.  No acute findings. 2. Mild degenerative change as described. Electronically Signed   By: Reuel Boomaniel  Micheline MazeBoyle M.D.   On: 11/07/2020 15:44   ECHOCARDIOGRAM COMPLETE  Result Date: 11/09/2020    ECHOCARDIOGRAM REPORT   Patient Name:   Sarita BottomSUNITA Coco Date of Exam: 11/09/2020 Medical Rec #:  [id number]        Height:       63.0 in Accession #:    [id number]       Weight:       158.5 lb Date of Birth:  1961/03/30       BSA:          1.752 m Patient Age:    59 years         BP:           145/79 mmHg Patient Gender: F                HR:           86 bpm. Exam Location:  Inpatient Procedure: 2D Echo, 3D Echo, Cardiac Doppler and Color Doppler Indications:    I50.40* Unspecified combined systolic (congestive) and diastolic                 (congestive) heart failure  History:        Patient has no prior history of Echocardiogram examinations.                 CHF, Signs/Symptoms:Shortness of Breath and Dyspnea; Risk                 Factors:Diabetes and Hypertension. Hypoxia.  Sonographer:    Sheralyn Boatmanina West RDCS Referring Phys: [id number] SENDIL K [facility]  Sonographer Comments: Technically difficult study due to poor echo windows. Image acquisition challenging due to patient body habitus and Restricted mobility. Patient could not stay in left decubitus position due to frozen left shoulder. Patient sensitive to pressure from probe. [facility] to patient discomfort and coughing. IMPRESSIONS  1. The pulmonic valve was abnormal. Severe pulmonic stenosis. Peak velocity 4 m/s, mean gradient 32 mmHg. Turbulent color flow at the level of the valve into main pulmonary artery. The valve is incompletely visualized. Consider cardiology consultation and further imaging to better assess RVOT, PV and PA.  2. Left ventricular ejection fraction, by estimation, is 55 to 60%. The left ventricle has normal function. The left ventricle has no regional wall motion abnormalities. There is mild left ventricular hypertrophy. Left  ventricular diastolic function could not be evaluated.  3. Right ventricular systolic function is normal. The right ventricular size is normal. Tricuspid regurgitation signal is inadequate for assessing PA pressure.  4. The mitral valve is normal in structure. Trivial mitral valve regurgitation. No evidence of mitral stenosis.  5. The aortic valve is tricuspid. Aortic valve regurgitation is not visualized. No aortic stenosis is present.  6. The inferior vena cava is dilated in size with >50% respiratory variability, suggesting right atrial pressure of 8 mmHg. FINDINGS  Left Ventricle: Left ventricular ejection fraction, by estimation, is 55 to 60%. The left ventricle has normal function. The left ventricle has no regional wall motion abnormalities. The left ventricular internal cavity size was normal in size. There is  mild left ventricular hypertrophy. Left ventricular diastolic function could not be evaluated due to nondiagnostic images. Left ventricular diastolic function could not be evaluated. Right Ventricle: The right ventricular size is normal. No increase in right ventricular wall thickness. Right ventricular systolic function is normal. Tricuspid regurgitation signal is inadequate for assessing PA pressure. Left Atrium: Left atrial  size was normal in size. Right Atrium: Right atrial size was normal in size. Pericardium: There is no evidence of pericardial effusion. Presence of pericardial fat pad. Mitral Valve: The mitral valve is normal in structure. Trivial mitral valve regurgitation. No evidence of mitral valve stenosis. Tricuspid Valve: The tricuspid valve is normal in structure. Tricuspid valve regurgitation is trivial. No evidence of tricuspid stenosis. Aortic Valve: The aortic valve is tricuspid. Aortic valve regurgitation is not visualized. No aortic stenosis is present. Pulmonic Valve: The pulmonic valve was abnormal. Pulmonic valve regurgitation is trivial. Severe pulmonic stenosis. Aorta: The  aortic root is normal in size and structure. Venous: The inferior vena cava is dilated in size with greater than 50% respiratory variability, suggesting right atrial pressure of 8 mmHg. IAS/Shunts: No atrial level shunt detected by color flow Doppler.  LEFT VENTRICLE PLAX 2D LVIDd:         4.20 cm     Diastology LVIDs:         2.80 cm     LV e' medial:    6.09 cm/s LV PW:         1.00 cm     LV E/e' medial:  16.7 LV IVS:        1.30 cm     LV e' lateral:   9.36 cm/s LVOT diam:     1.70 cm     LV E/e' lateral: 10.9 LV SV:         52 LV SV Index:   30 LVOT Area:     2.27 cm  LV Volumes (MOD) LV vol d, MOD A4C: 52.8 ml LV vol s, MOD A4C: 23.4 ml LV SV MOD A4C:     52.8 ml RIGHT VENTRICLE             IVC RV S prime:     16.10 cm/s  IVC diam: 2.30 cm RVOT diam:      2.10 cm TAPSE (M-mode): 1.8 cm LEFT ATRIUM             Index       RIGHT ATRIUM           Index LA diam:        3.20 cm 1.83 cm/m  RA Area:     11.50 cm LA Vol (A2C):   30.0 ml 17.13 ml/m RA Volume:   24.70 ml  14.10 ml/m LA Vol (A4C):   43.3 ml 24.72 ml/m LA Biplane Vol: 36.9 ml 21.07 ml/m  AORTIC VALVE             PULMONIC VALVE LVOT Vmax:   124.00 cm/s PV Vmax:          3.94 m/s LVOT Vmean:  76.300 cm/s PV Vmean:         254.000 cm/s LVOT VTI:    0.230 m     PV VTI:           0.916 m                          PV Peak grad:     61.9 mmHg AORTA                    PV Mean grad:     31.0 mmHg Ao Root diam: 2.90 cm    PR End Diast Vel: 2.16 msec Ao Asc diam:  2.40 cm  MITRAL VALVE MV Area (PHT): 3.21 cm  SHUNTS MV Decel Time: 236 msec     Systemic VTI:  0.23 m MV E velocity: 102.00 cm/s  Systemic Diam: 1.70 cm MV A velocity: 105.00 cm/s  Pulmonic Diam: 2.10 cm MV E/A ratio:  0.97 Weston Brass MD Electronically signed by Weston Brass MD Signature Date/Time: 11/09/2020/11:28:31 PM    Final      Assessment and Plan:   1. Acute hypoxic respiratory failure with finding of pulmonic stenosis - will discuss echocardiogram findings with MD - per  our preliminary discussion, these patients are very preload dependent and will therefore pause further IV Lasix for now. The suspicion is that perhaps the pain medication contributed to low blood pressure and therefore lower forward flow, exacerbating her hypoxia - outpatient management may be challenging given language barrier, deafness, and lack of insurance - see additional recs below  2. Right hand pain/swelling, also with recent left shoulder pain as well - may represent rheumatologic progress - blood cultures in progress  3. Anemia of uncertain chronicity - further w/u per IM, ? related to rheumatologic progress - anemia indices underway - no bleeding reported  4. Acute kidney injury - improved  5. Pulmonary nodule - 4mm RML nodule - can consider OP f/u for monitoring, will need primary care to follow  6. HTN - BP better, continues on amlodipine, carvedilol, and losartan - continue to hold HCTZ  For questions or updates, please contact [facility] Please consult [email or web address] for contact info under    Signed, Laurann Montana, PA-C  11/10/2020 10:27 AM   Patient seen and examined and agree with Ronie Spies, PA-C as detailed above.  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 Here, TTE suggestive of severe pulmonic stenosis for which Cardiology has been consulted.  Reviewed TTE images at length and stenosis does appear to be at the level of the valve although the valve is not well visualized. Peak velocity 32m/s, mean gradient . RV with normal systolic function. LVEF 55-60%.   Suspect that patient's acute decompensation was secondary to dehydration (had been vomiting) and additional drop in preload with pain  medication administration. PS is a preload dependent lesion and if hypotensive, there is underfilling of the LV and syncope can result. In addition, if not enough blood is able to get to the pulmonary arteries, the patient will be hypoxic as well. In this case, it is importance to maintain sufficient preload and avoid dehydration to prevent symptoms from recurring. Given symptoms and severity on TTE, she does merit intervention at this time. Unfortunately, we do not perform pulmonic valve interventions in [place], but will refer to congenital specialist, Dr. Meredeth Ide at [facility]. She likely needs further work-up of PS including TEE but will refer to [facility]. This can be done as an out-patient as long as the patient remains clinically and HD stable.  Etiology of PS unclear. Possibly congenital. No history of carcinoid, weight loss supplement use, history of rheumatic fever. No family history of congential heart disease that they know of. No other lesions noted on surface echo.   GEN: No acute distress. Near deaf but can read her sons lips   Neck: No JVD Cardiac: RRR, 2/6 harsh systolic murmur best heard at RUSB Respiratory: Clear to auscultation bilaterally. GI: Soft, nontender,  non-distended  MS: No edema; No deformity. Neuro:  Nonfocal  Psych: Normal affect   Plan: -Will refer to Dr. Theodis Sato at [facility] for further work-up and management -Likely needs TEE to further understand valvular anatomy/etiology of stenosis prior to intervention -Patient is preload dependent; avoid dehydration and lasix administration -Discussed importance of maintaining good hydration and using compression socks to prevent venous pooling if standing for a prolonged period of time -Ambulate and monitor patient's O2 sats with goal >92%; hypoxia will make symptoms worse -Agree with consulting social work as patient is uninsured  -Management of anemia and right hand swelling per primary team  Total time of encounter: 50  minutes total time of encounter, including 30 minutes spent in face-to-face patient care on the date of this encounter. This time includes coordination of care and counseling regarding above mentioned problem list. Remainder of non-face-to-face time involved reviewing chart documents/testing relevant to the patient encounter and documentation in the medical record. I have independently reviewed documentation from referring provider.   Laurance Flatten, MD [place]  [facility]   Laurance Flatten, MD

## 2020-11-10 NOTE — Plan of Care (Signed)
  Problem: Education: Goal: Knowledge of General Education information will improve Description Including pain rating scale, medication(s)/side effects and non-pharmacologic comfort measures Outcome: Progressing   Problem: Health Behavior/Discharge Planning: Goal: Ability to manage health-related needs will improve Outcome: Progressing   Problem: Clinical Measurements: Goal: Ability to maintain clinical measurements within normal limits will improve Outcome: Progressing Goal: Will remain free from infection Outcome: Progressing Goal: Diagnostic test results will improve Outcome: Progressing Goal: Respiratory complications will improve Outcome: Progressing Goal: Cardiovascular complication will be avoided Outcome: Progressing   Problem: Activity: Goal: Risk for activity intolerance will decrease Outcome: Progressing   Problem: Nutrition: Goal: Adequate nutrition will be maintained Outcome: Progressing   Problem: Elimination: Goal: Will not experience complications related to bowel motility Outcome: Progressing Goal: Will not experience complications related to urinary retention Outcome: Progressing   Problem: Pain Managment: Goal: General experience of comfort will improve Outcome: Progressing   Problem: Safety: Goal: Ability to remain free from injury will improve Outcome: Progressing   

## 2020-11-10 NOTE — Plan of Care (Signed)
  Problem: Education: Goal: Knowledge of General Education information will improve Description: Including pain rating scale, medication(s)/side effects and non-pharmacologic comfort measures Outcome: Progressing   Problem: Nutrition: Goal: Adequate nutrition will be maintained Outcome: Progressing   Problem: Elimination: Goal: Will not experience complications related to bowel motility Outcome: Progressing   Problem: Pain Managment: Goal: General experience of comfort will improve Outcome: Progressing   Problem: Safety: Goal: Ability to remain free from injury will improve Outcome: Progressing  Patient able to tolerate fluids well. Patient is doing well with ambulation and not desatting.

## 2020-11-10 NOTE — TOC Initial Note (Signed)
Transition of Care [facility]) - Initial/Assessment Note    Patient Details  Name: Andrea Middleton MRN: [id number] Date of Birth: Jan 20, 1961  Transition of Care [facility] CM/SW Contact:    Bartholome Bill, RN Phone Number: 11/10/2020, 3:42 PM  Clinical Narrative:                 [facility] consult for insurance assistance. Financial counselor emailed to screen patient for Medicaid. TOC will watch for DC medications to see if pt qualifies for [facility] program.      Activities of Daily Living Home Assistive Devices/Equipment: Eyeglasses,CBG Meter ADL Screening (condition at time of admission) Patient's cognitive ability adequate to safely complete daily activities?: Yes Is the patient deaf or have difficulty hearing?: No Does the patient have difficulty seeing, even when wearing glasses/contacts?: No Does the patient have difficulty concentrating, remembering, or making decisions?: No Patient able to express need for assistance with ADLs?: Yes Does the patient have difficulty dressing or bathing?: Yes Independently performs ADLs?: No Communication: Independent Dressing (OT): Needs assistance Is this a change from baseline?: Pre-admission baseline Grooming: Needs assistance Is this a change from baseline?: Pre-admission baseline Feeding: Needs assistance Is this a change from baseline?: Pre-admission baseline Bathing: Needs assistance Is this a change from baseline?: Pre-admission baseline Toileting: Needs assistance Is this a change from baseline?: Pre-admission baseline In/Out Bed: Needs assistance Is this a change from baseline?: Pre-admission baseline Walks in Home: Needs assistance Is this a change from baseline?: Pre-admission baseline Does the patient have difficulty walking or climbing stairs?: Yes (secondary to weakness) Weakness of Legs: Both Weakness of Arms/Hands: None  Permission Sought/Granted                  Emotional Assessment              Admission  diagnosis:  Hypoxia [R09.02] Right hand pain [M79.641] Severe sepsis (HCC) [A41.9, R65.20] Patient Active Problem List   Diagnosis Date Noted  . Overweight (BMI 25.0-29.9) 11/09/2020  . Essential hypertension 11/09/2020  . Controlled type 2 diabetes mellitus with hyperglycemia (HCC) 11/09/2020  . Hyperlipidemia 11/09/2020  . Anemia 11/09/2020  . AKI (acute kidney injury) (HCC) 11/09/2020  . Pseudogout of hand, right 11/09/2020  . Acute diastolic CHF (congestive heart failure) (HCC) 11/09/2020  . Right middle lobe pulmonary nodule 11/09/2020  . Right ear impacted cerumen 11/09/2020  . Acute on chronic respiratory failure with hypoxia (HCC) 11/08/2020   PCP:  Oneita Hurt, No Pharmacy:   [facility] [address] [place], [place] - [address] [place] [address] Phone: [phone] Fax: [phone]     Social Determinants of Health (SDOH) Interventions    Readmission Risk Interventions No flowsheet data found.

## 2020-11-11 LAB — CBC WITH DIFFERENTIAL/PLATELET
Abs Immature Granulocytes: 0.05 10*3/uL (ref 0.00–0.07)
Basophils Absolute: 0 10*3/uL (ref 0.0–0.1)
Basophils Relative: 0 %
Eosinophils Absolute: 0 10*3/uL (ref 0.0–0.5)
Eosinophils Relative: 0 %
HCT: 29.8 % — ABNORMAL LOW (ref 36.0–46.0)
Hemoglobin: 9.5 g/dL — ABNORMAL LOW (ref 12.0–15.0)
Immature Granulocytes: 1 %
Lymphocytes Relative: 12 %
Lymphs Abs: 1.1 10*3/uL (ref 0.7–4.0)
MCH: 25 pg — ABNORMAL LOW (ref 26.0–34.0)
MCHC: 31.9 g/dL (ref 30.0–36.0)
MCV: 78.4 fL — ABNORMAL LOW (ref 80.0–100.0)
Monocytes Absolute: 0.7 10*3/uL (ref 0.1–1.0)
Monocytes Relative: 8 %
Neutro Abs: 7.5 10*3/uL (ref 1.7–7.7)
Neutrophils Relative %: 79 %
Platelets: 321 10*3/uL (ref 150–400)
RBC: 3.8 MIL/uL — ABNORMAL LOW (ref 3.87–5.11)
RDW: 15.6 % — ABNORMAL HIGH (ref 11.5–15.5)
WBC: 9.3 10*3/uL (ref 4.0–10.5)
nRBC: 0 % (ref 0.0–0.2)

## 2020-11-11 LAB — IRON AND TIBC
Iron: 24 ug/dL — ABNORMAL LOW (ref 28–170)
Saturation Ratios: 7 % — ABNORMAL LOW (ref 10.4–31.8)
TIBC: 366 ug/dL (ref 250–450)
UIBC: 342 ug/dL

## 2020-11-11 LAB — BASIC METABOLIC PANEL
Anion gap: 12 (ref 5–15)
BUN: 25 mg/dL — ABNORMAL HIGH (ref 6–20)
CO2: 33 mmol/L — ABNORMAL HIGH (ref 22–32)
Calcium: 9.4 mg/dL (ref 8.9–10.3)
Chloride: 97 mmol/L — ABNORMAL LOW (ref 98–111)
Creatinine, Ser: 0.9 mg/dL (ref 0.44–1.00)
GFR, Estimated: >60 mL/min (ref >60)
Glucose, Bld: 187 mg/dL — ABNORMAL HIGH (ref 70–99)
Potassium: 4 mmol/L (ref 3.5–5.1)
Sodium: 142 mmol/L (ref 135–145)

## 2020-11-11 LAB — GLUCOSE, CAPILLARY
Glucose-Capillary: 228 mg/dL — ABNORMAL HIGH (ref 70–99)
Glucose-Capillary: 280 mg/dL — ABNORMAL HIGH (ref 70–99)

## 2020-11-11 LAB — FERRITIN: Ferritin: 75 ng/mL (ref 11–307)

## 2020-11-11 MED ORDER — FERROUS SULFATE 325 (65 FE) MG PO TABS: 325.0000 mg | ORAL_TABLET | Freq: Every day | ORAL | Status: DC

## 2020-11-11 MED ORDER — SODIUM CHLORIDE 0.9 % IV SOLN
510.0000 mg | Freq: Once | INTRAVENOUS | Status: AC
  Administered 2020-11-11: 510 mg via INTRAVENOUS
  Filled 2020-11-11: qty 510

## 2020-11-11 MED ORDER — FERROUS SULFATE 325 (65 FE) MG PO TABS: 325.0000 mg | ORAL_TABLET | Freq: Every day | ORAL | 1 refills | Status: AC

## 2020-11-11 MED ORDER — PREDNISONE 5 MG PO TABS
5.0000 mg | ORAL_TABLET | Freq: Every day | ORAL | 0 refills | Status: AC
Start: 2020-11-12 — End: ?

## 2020-11-11 MED ORDER — NAPROXEN 375 MG PO TABS: 375.0000 mg | ORAL_TABLET | Freq: Two times a day (BID) | ORAL | 0 refills | Status: AC | PRN

## 2020-11-11 NOTE — Discharge Summary (Signed)
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  In the emergency department, her CRP was found to be elevated, elevated creatinine, normal uric acid level.  She was being discharged with anti-inflammatory medications when she passed out in the emergency department.  She was found to be hypoxic requiring 4 L of oxygen per minute.  Chest x-ray showed left perihilar air space opacities, CT angiogram did not show any evidence of PE but showed incidental right small middle lobe nodule.  COVID-negative.  BNP found to be elevated.  [facility].    Echocardiogram showed severe pulmonary stenosis, preserved left ventricular function.  Cardiology consulted .  Hospital course remained stable.  She remained hemodynamically stable.  Hypoxia resolved.  Right hand swelling and pain resolved.  Cardiology recommended to follow-up with Andrea Middleton as an outpatient to discuss about management of pulmonary valve stenosis.  She is medically stable for discharge home today.  Appointment has been set up with Andrea Middleton.  Following problems were addressed during her  Hospitalization:   Syncopal episode/severe pulmonary stenosis: Had a syncopal episode while being discharged from the emergency department.  After that, she became hypoxic.  Echocardiogram showed severe pulmonary stenosis.  Normal left ventricular function, no evidence of right heart failure.  No history of congenital heart disease.  Cardiology consulted. Cardiology recommended pulmonary valve  replacements/valvuloplasty which is not possible at our center.  She will follow-[facility] with [facility] Crist[facility]. Currently she is hemodynamically stable. Patient will be recommended to avoid dehydration, avoid diuretics, compression socks to prevent venous pooling.  Acute respiratory failure with hypoxia: Currently respiratory status is stable.   BNP elevated so started on IV Lasix which has been stopped.   CT showed mild hazy airspace opacity within the posterior left lung base which is most likely atelectasis.  Pneumonia is less likely.  Her lungs are clear on auscultation today.  No fever or leukocytosis.  Needs to avoid Lasix in order to preserve preload.  No need for antibiotics  Diabetes type 2: Hemoglobin A1c of 7.3.  Continue insulin and oral medications at home  Hyperlipidemia: On statin  Microcytic anemia: Unclear etiology.  No history of bleeding.  urrently hemoglobin stable.  Iron studies showed low iron.  She was given a dose of iron infusion before discharge.  Continue oral supplementation  AKI: Resolved  Right hand pain: Unclear etiology.  No history of arthritis .  Uric acid level normal.  CRP level elevated. Hand xray showed mild periarticular osteopenia,minimal degenerative change over the first carpometacarpal joint and scaphoid trapezium joint. No fracture or dislocation. Has history of left frozen shoulder.  Follow-up with rheumatology as an outpatient if needed.  Possibility of pseudogout.  Could not rule out rheumatoid arthritis but the arthritis is monoarticular.  CCP antibody and rheumatoid factor pending.  She will be discharged on short course of prednisone and as needed naproxen  Right middle lobe pulm nodule:4 mm. Incidental finding.  No follow-up recommended because she is a non-smoker.  Hypertension: Currently blood stable.  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  Noted to have significant amount of wax in the right ear.  We recommend to follow-up with ENT as an outpatient.    Discharge Diagnoses:  Principal Problem:   Acute on chronic respiratory failure with hypoxia (HCC) Active Problems:   Overweight (BMI 25.0-29.9)   Essential hypertension   Controlled type 2 diabetes mellitus with hyperglycemia (HCC)   Hyperlipidemia   Anemia   AKI (acute kidney injury) (HCC)   Pseudogout of hand, right   Acute diastolic CHF (congestive heart failure) (HCC)   Right middle lobe pulmonary nodule   Right ear impacted cerumen    Discharge Instructions  Discharge Instructions    Diet Carb Modified   Complete by: As directed    Discharge instructions   Complete by: As directed    1)Please take prescribed medications as instructed 2)Follow up with cardiology, Dr Andrea Ide ,on given appointment date.  You have an appointment on 11/19/20 at 9 AM. 3)Please follow-up with your PCP in 2 weeks. 4)Monitor your blood sugars at home.  Continue taking your antidiabetic medications. 5)Prevent yourself from dehydration. If possible wear compression socks if you have to stand for prolonged period of time 6)If possible,follow up with ENT as an outpatient for the evaluation of your hearing loss   Increase activity slowly   Complete by: As directed    No wound care   Complete by: As directed      Allergies as of 11/11/2020   No Known Allergies     Medication List    STOP taking these medications   hydrochlorothiazide 25 MG tablet Commonly known as: HYDRODIURIL     TAKE these medications   amLODipine 10 MG tablet Commonly known as: NORVASC Take 10 mg by mouth daily.   Aspirin Low Dose 81 MG EC tablet Generic drug: aspirin Take 81 mg by mouth daily.   atorvastatin 40 MG tablet Commonly known as: LIPITOR Take 40 mg by mouth at bedtime.   carvedilol 25 MG tablet Commonly known as: COREG Take 25 mg by mouth 2 (two) times daily.   fenofibrate  48 MG tablet Commonly known as: TRICOR Take 48 mg by mouth daily.   ferrous sulfate 325 (65 FE) MG tablet Take 1 tablet (325 mg total) by mouth daily with breakfast. Start taking on: November 12, 2020   glipiZIDE 10 MG tablet Commonly known as: GLUCOTROL Take 5 mg by mouth 2 (two) times daily before a meal.   insulin glargine 100 unit/mL Sopn Commonly known as: LANTUS Inject 10 Units into the skin 2 (two) times daily.   losartan 100 MG tablet Commonly known as: COZAAR Take 100 mg by mouth daily.   metFORMIN 1000 MG tablet Commonly known as: GLUCOPHAGE Take 1,000 mg by mouth 2 (two) times daily with a meal.   multivitamin with minerals Tabs tablet Take 1 tablet by mouth daily.   naproxen 375 MG tablet Commonly known as: NAPROSYN Take 1 tablet (375 mg total) by mouth every 12 (twelve) hours as needed (For right hand moderate pain).   ondansetron 4 MG disintegrating tablet Commonly known as: Zofran ODT Take 1 tablet (4 mg total) by mouth every 8 (eight) hours as needed for nausea or vomiting.   predniSONE 5 MG tablet Commonly known as: DELTASONE Take 1 tablet (5 mg total) by mouth daily with breakfast. Take 3 pills for 1 day then 2 pill for 1 day then 1 pill for 1 day then stop Start taking on: November 12, 2020       Follow-up Information    Andrea Folks, MD. Schedule an appointment as soon as possible for a visit in 1 week(s).   Specialties: Pediatrics, Cardiology Contact information: [address], [address] [place] [address] [phone]              No Known Allergies  Consultations:  cardiology   Procedures/Studies: CT Angio Chest PE W and/or Wo Contrast  Result Date: 11/07/2020 CLINICAL DATA:  Low O2 sats EXAM: CT ANGIOGRAPHY CHEST WITH CONTRAST TECHNIQUE: Multidetector CT imaging of the chest was performed using the standard protocol during bolus administration of intravenous contrast. Multiplanar CT image reconstructions and  MIPs were obtained to evaluate the vascular anatomy. CONTRAST:  55mL OMNIPAQUE IOHEXOL 350 MG/ML SOLN COMPARISON:  None. FINDINGS: Cardiovascular: There is a optimal opacification of the pulmonary arteries. There is no central,segmental, or subsegmental filling defects within the pulmonary arteries. The heart is normal in size. No pericardial effusion or thickening. No evidence right heart strain. There is normal three-vessel brachiocephalic anatomy without proximal stenosis. The thoracic aorta is normal in appearance. Mediastinum/Nodes: No hilar, mediastinal, or axillary adenopathy. Thyroid gland, trachea, and esophagus demonstrate no significant findings. Lungs/Pleura: Hazy airspace opacity seen at the periphery of the periphery of the posterior left lung base. There is a small 4 mm nodule seen within the anterior right middle lobe. No pleural effusion or pneumothorax is noted. Upper Abdomen: No acute abnormalities present in the visualized portions of the upper abdomen. Musculoskeletal: No chest wall abnormality. There is an 8 mm round sclerotic lesion seen within the T10 vertebral body. Review of the MIP images confirms the above findings. IMPRESSION: No central, segmental, or subsegmental pulmonary embolism. Mildly hazy airspace opacity within the posterior left lung base which could be due to atelectasis and/or early infectious etiology. 4 mm pulmonary nodule within the right middle lobe. No follow-up needed if patient is low-risk. Non-contrast chest CT can be considered in 12 months if patient is high-risk. This recommendation follows the consensus statement: Guidelines for Management of Incidental Pulmonary Nodules Detected on CT Images: From the Fleischner Society 2017; Radiology 2017; 284:228-243. Electronically Signed   By: Jonna Clark M.D.   On: 11/07/2020 23:17   DG Chest Portable 1 View  Result Date: 11/07/2020 CLINICAL DATA:  Low O2 sats EXAM: PORTABLE CHEST 1 VIEW COMPARISON:  None. FINDINGS:  The heart size and mediastinal contours are within normal limits. There appears to be a hazy left perihilar opacities. A somewhat nodular appearance is seen within the left perihilar region which measures 1 cm. The visualized skeletal structures are unremarkable. IMPRESSION: Left perihilar airspace opacities and a 1 cm left perihilar nodular opacity, which is non-specific and would recommend dedicated chest CT for further evaluation. Electronically Signed   By: Jonna Clark M.D.   On: 11/07/2020 22:21   DG Hand Complete Right  Result Date: 11/07/2020 CLINICAL DATA:  Acute onset right hand pain and swelling. No injury. EXAM: RIGHT HAND - COMPLETE 3+ VIEW COMPARISON:  None. FINDINGS: Alignment is normal. There is mild periarticular osteopenia. No bony erosions. Minimal degenerative change over the first carpometacarpal joint and scaphoid trapezium joint. No fracture or dislocation. IMPRESSION: 1. No acute findings. 2. Mild degenerative change as described. Electronically Signed   By: Elberta Fortis M.D.   On: 11/07/2020 15:44   ECHOCARDIOGRAM COMPLETE  Result Date: 11/09/2020    ECHOCARDIOGRAM REPORT   Patient Name:   Andrea Middleton Date of Exam: 11/09/2020  Medical Rec #:  [id number]        Height:       63.0 in Accession #:    [id number]       Weight:       158.5 lb Date of Birth:  11/28/1960       BSA:          1.752 m Patient Age:    59 years         BP:           145/79 mmHg Patient Gender: F                HR:           86 bpm. Exam Location:  Inpatient Procedure: 2D Echo, 3D Echo, Cardiac Doppler and Color Doppler Indications:    I50.40* Unspecified combined systolic (congestive) and diastolic                 (congestive) heart failure  History:        Patient has no prior history of Echocardiogram examinations.                 CHF, Signs/Symptoms:Shortness of Breath and Dyspnea; Risk                 Factors:Diabetes and Hypertension. Hypoxia.  Sonographer:    Sheralyn Boatmanina West RDCS Referring Phys: [id number] SENDIL K  [facility]  Sonographer Comments: Technically difficult study due to poor echo windows. Image acquisition challenging due to patient body habitus and Restricted mobility. Patient could not stay in left decubitus position due to frozen left shoulder. Patient sensitive to pressure from probe. End[facility] due to patient discomfort and coughing. IMPRESSIONS  1. The pulmonic valve was abnormal. Severe pulmonic stenosis. Peak velocity 4 m/s, mean gradient 32 mmHg. Turbulent color flow at the level of the valve into main pulmonary artery. The valve is incompletely visualized. Consider cardiology consultation and further imaging to better assess RVOT, PV and PA.  2. Left ventricular ejection fraction, by estimation, is 55 to 60%. The left ventricle has normal function. The left ventricle has no regional wall motion abnormalities. There is mild left ventricular hypertrophy. Left ventricular diastolic function could not be evaluated.  3. Right ventricular systolic function is normal. The right ventricular size is normal. Tricuspid regurgitation signal is inadequate for assessing PA pressure.  4. The mitral valve is normal in structure. Trivial mitral valve regurgitation. No evidence of mitral stenosis.  5. The aortic valve is tricuspid. Aortic valve regurgitation is not visualized. No aortic stenosis is present.  6. The inferior vena cava is dilated in size with >50% respiratory variability, suggesting right atrial pressure of 8 mmHg. FINDINGS  Left Ventricle: Left ventricular ejection fraction, by estimation, is 55 to 60%. The left ventricle has normal function. The left ventricle has no regional wall motion abnormalities. The left ventricular internal cavity size was normal in size. There is  mild left ventricular hypertrophy. Left ventricular diastolic function could not be evaluated due to nondiagnostic images. Left ventricular diastolic function could not be evaluated. Right Ventricle: The right  ventricular size is normal. No increase in right ventricular wall thickness. Right ventricular systolic function is normal. Tricuspid regurgitation signal is inadequate for assessing PA pressure. Left Atrium: Left atrial size was normal in size. Right Atrium: Right atrial size was normal in size. Pericardium: There is no evidence of pericardial effusion. Presence of pericardial fat pad. Mitral Valve: The mitral  valve is normal in structure. Trivial mitral valve regurgitation. No evidence of mitral valve stenosis. Tricuspid Valve: The tricuspid valve is normal in structure. Tricuspid valve regurgitation is trivial. No evidence of tricuspid stenosis. Aortic Valve: The aortic valve is tricuspid. Aortic valve regurgitation is not visualized. No aortic stenosis is present. Pulmonic Valve: The pulmonic valve was abnormal. Pulmonic valve regurgitation is trivial. Severe pulmonic stenosis. Aorta: The aortic root is normal in size and structure. Venous: The inferior vena cava is dilated in size with greater than 50% respiratory variability, suggesting right atrial pressure of 8 mmHg. IAS/Shunts: No atrial level shunt detected by color flow Doppler.  LEFT VENTRICLE PLAX 2D LVIDd:         4.20 cm     Diastology LVIDs:         2.80 cm     LV e' medial:    6.09 cm/s LV PW:         1.00 cm     LV E/e' medial:  16.7 LV IVS:        1.30 cm     LV e' lateral:   9.36 cm/s LVOT diam:     1.70 cm     LV E/e' lateral: 10.9 LV SV:         52 LV SV Index:   30 LVOT Area:     2.27 cm  LV Volumes (MOD) LV vol d, MOD A4C: 52.8 ml LV vol s, MOD A4C: 23.4 ml LV SV MOD A4C:     52.8 ml RIGHT VENTRICLE             IVC RV S prime:     16.10 cm/s  IVC diam: 2.30 cm RVOT diam:      2.10 cm TAPSE (M-mode): 1.8 cm LEFT ATRIUM             Index       RIGHT ATRIUM           Index LA diam:        3.20 cm 1.83 cm/m  RA Area:     11.50 cm LA Vol (A2C):   30.0 ml 17.13 ml/m RA Volume:   24.70 ml  14.10 ml/m LA Vol (A4C):   43.3 ml 24.72 ml/m LA  Biplane Vol: 36.9 ml 21.07 ml/m  AORTIC VALVE             PULMONIC VALVE LVOT Vmax:   124.00 cm/s PV Vmax:          3.94 m/s LVOT Vmean:  76.300 cm/s PV Vmean:         254.000 cm/s LVOT VTI:    0.230 m     PV VTI:           0.916 m                          PV Peak grad:     61.9 mmHg AORTA                    PV Mean grad:     31.0 mmHg Ao Root diam: 2.90 cm    PR End Diast Vel: 2.16 msec Ao Asc diam:  2.40 cm  MITRAL VALVE MV Area (PHT): 3.21 cm     SHUNTS MV Decel Time: 236 msec     Systemic VTI:  0.23 m MV E velocity: 102.00 cm/s  Systemic Diam: 1.70 cm MV A velocity: 105.00  cm/s  Pulmonic Diam: 2.10 cm MV E/A ratio:  0.97 Weston Brass MD Electronically signed by Weston Brass MD Signature Date/Time: 11/09/2020/11:28:31 PM    Final        Subjective: Patient seen and examined the bedside this morning.  Hemodynamically stable for discharge today.  Discharge Exam: Vitals:   11/10/20 2023 11/11/20 0541  BP: 130/66 137/76  Pulse: 89 76  Resp: 14 16  Temp: 97.7 F (36.5 C) 97.6 F (36.4 C)  SpO2: 91% 92%   Vitals:   11/10/20 0615 11/10/20 1428 11/10/20 2023 11/11/20 0541  BP: (!) 133/58 (!) 144/68 130/66 137/76  Pulse: 90 92 89 76  Resp:  (!) 21 14 16   Temp: 98.9 F (37.2 C) 98.3 F (36.8 C) 97.7 F (36.5 C) 97.6 F (36.4 C)  TempSrc: Oral Oral Oral Oral  SpO2: 94% (!) 89% 91% 92%  Weight:    69.8 kg  Height:        General: Pt is alert, awake, not in acute distress Cardiovascular: RRR, S1/S2 +, no rubs, no gallops Respiratory: CTA bilaterally, no wheezing, no rhonchi Abdominal: Soft, NT, ND, bowel sounds + Extremities: no edema, no cyanosis    The results of significant diagnostics from this hospitalization (including imaging, microbiology, ancillary and laboratory) are listed below for reference.     Microbiology: Recent Results (from the past 240 hour(s))  Resp Panel by RT-PCR (Flu A&B, Covid) Nasopharyngeal Swab     Status: None   Collection Time: 11/07/20   9:45 PM   Specimen: Nasopharyngeal Swab; Nasopharyngeal(NP) swabs in vial transport medium  Result Value Ref Range Status   SARS Coronavirus 2 by RT PCR NEGATIVE NEGATIVE Final    Comment: (NOTE) SARS-CoV-2 target nucleic acids are NOT DETECTED.  The SARS-CoV-2 RNA is generally detectable in upper respiratory specimens during the acute phase of infection. The lowest concentration of SARS-CoV-2 viral copies this assay can detect is 138 copies/mL. A negative result does not preclude SARS-Cov-2 infection and should not be used as the sole basis for treatment or other patient management decisions. A negative result may occur with  improper specimen collection/handling, submission of specimen other than nasopharyngeal swab, presence of viral mutation(s) within the areas targeted by this assay, and inadequate number of viral copies(<138 copies/mL). A negative result must be combined with clinical observations, patient history, and epidemiological information. The expected result is Negative.  Fact Sheet for Patients:  BloggerCourse.com  Fact Sheet for Healthcare Providers:  SeriousBroker.it  This test is no t yet approved or cleared by the [place] FDA and  has been authorized for detection and/or diagnosis of SARS-CoV-2 by FDA under an Emergency Use Authorization (EUA). This EUA will remain  in effect (meaning this test can be used) for the duration of the COVID-19 declaration under Section 564(b)(1) of the Act, 21 U.S.C.section 360bbb-3(b)(1), unless the authorization is terminated  or revoked sooner.       Influenza A by PCR NEGATIVE NEGATIVE Final   Influenza B by PCR NEGATIVE NEGATIVE Final    Comment: (NOTE) The Xpert Xpress SARS-CoV-2/FLU/RSV plus assay is intended as an aid in the diagnosis of influenza from Nasopharyngeal swab specimens and should not be used as a sole basis for treatment. Nasal washings and aspirates  are unacceptable for Xpert Xpress SARS-CoV-2/FLU/RSV testing.  Fact Sheet for Patients: BloggerCourse.com  Fact Sheet for Healthcare Providers: SeriousBroker.it  This test is not yet approved or cleared by the [place] and has been authorized for  detection and/or diagnosis of SARS-CoV-2 by FDA under an Emergency Use Authorization (EUA). This EUA will remain in effect (meaning this test can be used) for the duration of the COVID-19 declaration under Section 564(b)(1) of the Act, 21 U.S.C. section 360bbb-3(b)(1), unless the authorization is terminated or revoked.  Performed at [facility], [address] [place], [place] [address]   Blood culture (routine x 2)     Status: None (Preliminary result)   Collection Time: 11/08/20 12:10 AM   Specimen: BLOOD  Result Value Ref Range Status   Specimen Description   Final    BLOOD LEFT ANTECUBITAL Performed at [facility], [address] [place], [place] [address]    Special Requests   Final    BOTTLES DRAWN AEROBIC AND ANAEROBIC Blood Culture adequate volume Performed at [facility], [address] [place], [place] [address]    Culture   Final    NO GROWTH 3 DAYS Performed at [facility], [address] [place], [place] [address]    Report Status PENDING  Incomplete  Blood culture (routine x 2)     Status: None (Preliminary result)   Collection Time: 11/08/20 12:12 AM   Specimen: BLOOD LEFT HAND  Result Value Ref Range Status   Specimen Description   Final    BLOOD LEFT HAND Performed at [facility], [address] [place], [place] [address]    Special Requests   Final    BOTTLES DRAWN AEROBIC AND ANAEROBIC Blood Culture results may not be optimal due to an inadequate volume of blood received in culture bottles Performed at [facility], [address] [place], [place] [address]    Culture   Final     NO GROWTH 3 DAYS Performed at [facility], [address], [place], [place] [address]    Report Status PENDING  Incomplete     Labs: BNP (last 3 results) Recent Labs    11/09/20 0758  BNP 482.8*   Basic Metabolic Panel: Recent Labs  Lab 11/07/20 1840 11/08/20 0334 11/09/20 0757 11/10/20 0601 11/11/20 0538  NA 134* 134* 137 135 142  K 4.0 4.7 3.7 4.3 4.0  CL 94* 101 99 96* 97*  CO2 29 25 28 27  33*  GLUCOSE 161* 212* 108* 257* 187*  BUN 29* 32* 23* 23* 25*  CREATININE 1.27* 1.28* 1.00 0.98 0.90  CALCIUM 9.3 7.9* 8.8* 9.0 9.4   Liver Function Tests: Recent Labs  Lab 11/09/20 1303 11/10/20 0601  AST 20 21  ALT 17 21  ALKPHOS 44 46  BILITOT 0.5 0.6  PROT 6.7 7.0  ALBUMIN 3.2* 3.3*   No results for input(s): LIPASE, AMYLASE in the last 168 hours. No results for input(s): AMMONIA in the last 168 hours. CBC: Recent Labs  Lab 11/07/20 1840 11/09/20 0757 11/10/20 0601 11/11/20 0538  WBC 10.4 9.3 7.8 9.3  NEUTROABS 7.4 7.4  --  7.5  HGB 10.3* 8.5* 9.4* 9.5*  HCT 32.6* 27.4* 29.9* 29.8*  MCV 78.6* 79.7* 79.7* 78.4*  PLT 327 260 282 321   Cardiac Enzymes: No results for input(s): CKTOTAL, CKMB, CKMBINDEX, TROPONINI in the last 168 hours. BNP: Invalid input(s): POCBNP CBG: Recent Labs  Lab 11/09/20 2133 11/10/20 1246 11/10/20 1619 11/10/20 2024 11/11/20 0750  GLUCAP 221* 417* 374* 317* 228*   D-Dimer No results for input(s): DDIMER in the last 72 hours. Hgb A1c Recent  Labs    11/09/20 0120  HGBA1C 7.3*   Lipid Profile No results for input(s): CHOL, HDL, LDLCALC, TRIG, CHOLHDL, LDLDIRECT in the last 72 hours. Thyroid function studies No results for input(s): TSH, T4TOTAL, T3FREE, THYROIDAB in the last 72 hours.  Invalid input(s): FREET3 Anemia work up Recent Labs    11/11/20 0538  FERRITIN 75  TIBC 366  IRON 24*   Urinalysis    Component Value Date/Time   COLORURINE YELLOW 11/08/2020 0334   APPEARANCEUR CLEAR 11/08/2020 0334    LABSPEC 1.010 11/08/2020 0334   PHURINE 5.5 11/08/2020 0334   GLUCOSEU NEGATIVE 11/08/2020 0334   HGBUR NEGATIVE 11/08/2020 0334   BILIRUBINUR NEGATIVE 11/08/2020 0334   KETONESUR NEGATIVE 11/08/2020 0334   PROTEINUR 30 (A) 11/08/2020 0334   NITRITE NEGATIVE 11/08/2020 0334   LEUKOCYTESUR NEGATIVE 11/08/2020 0334   Sepsis Labs Invalid input(s): PROCALCITONIN,  WBC,  LACTICIDVEN Microbiology Recent Results (from the past 240 hour(s))  Resp Panel by RT-PCR (Flu A&B, Covid) Nasopharyngeal Swab     Status: None   Collection Time: 11/07/20  9:45 PM   Specimen: Nasopharyngeal Swab; Nasopharyngeal(NP) swabs in vial transport medium  Result Value Ref Range Status   SARS Coronavirus 2 by RT PCR NEGATIVE NEGATIVE Final    Comment: (NOTE) SARS-CoV-2 target nucleic acids are NOT DETECTED.  The SARS-CoV-2 RNA is generally detectable in upper respiratory specimens during the acute phase of infection. The lowest concentration of SARS-CoV-2 viral copies this assay can detect is 138 copies/mL. A negative result does not preclude SARS-Cov-2 infection and should not be used as the sole basis for treatment or other patient management decisions. A negative result may occur with  improper specimen collection/handling, submission of specimen other than nasopharyngeal swab, presence of viral mutation(s) within the areas targeted by this assay, and inadequate number of viral copies(<138 copies/mL). A negative result must be combined with clinical observations, patient history, and epidemiological information. The expected result is Negative.  Fact Sheet for Patients:  BloggerCourse.com  Fact Sheet for Healthcare Providers:  SeriousBroker.it  This test is no t yet approved or cleared by the [place] FDA and  has been authorized for detection and/or diagnosis of SARS-CoV-2 by FDA under an Emergency Use Authorization (EUA). This EUA will remain   in effect (meaning this test can be used) for the duration of the COVID-19 declaration under Section 564(b)(1) of the Act, 21 U.S.C.section 360bbb-3(b)(1), unless the authorization is terminated  or revoked sooner.       Influenza A by PCR NEGATIVE NEGATIVE Final   Influenza B by PCR NEGATIVE NEGATIVE Final    Comment: (NOTE) The Xpert Xpress SARS-CoV-2/FLU/RSV plus assay is intended as an aid in the diagnosis of influenza from Nasopharyngeal swab specimens and should not be used as a sole basis for treatment. Nasal washings and aspirates are unacceptable for Xpert Xpress SARS-CoV-2/FLU/RSV testing.  Fact Sheet for Patients: BloggerCourse.com  Fact Sheet for Healthcare Providers: SeriousBroker.it  This test is not yet approved or cleared by the [place] FDA and has been authorized for detection and/or diagnosis of SARS-CoV-2 by FDA under an Emergency Use Authorization (EUA). This EUA will remain in effect (meaning this test can be used) for the duration of the COVID-19 declaration under Section 564(b)(1) of the Act, 21 U.S.C. section 360bbb-3(b)(1), unless the authorization is terminated or revoked.  Performed at [facility], [address] [place], [place] [address]   Blood culture (routine x 2)  Status: None (Preliminary result)   Collection Time: 11/08/20 12:10 AM   Specimen: BLOOD  Result Value Ref Range Status   Specimen Description   Final    BLOOD LEFT ANTECUBITAL Performed at [facility], [address] [place], [place] [address]    Special Requests   Final    BOTTLES DRAWN AEROBIC AND ANAEROBIC Blood Culture adequate volume Performed at [facility], [address] [place], [place] [address]    Culture   Final    NO GROWTH 3 DAYS Performed at [facility], [address], [place], [place] [address]    Report Status PENDING  Incomplete  Blood culture  (routine x 2)     Status: None (Preliminary result)   Collection Time: 11/08/20 12:12 AM   Specimen: BLOOD LEFT HAND  Result Value Ref Range Status   Specimen Description   Final    BLOOD LEFT HAND Performed at [facility], [address] [place], [place] [address]    Special Requests   Final    BOTTLES DRAWN AEROBIC AND ANAEROBIC Blood Culture results may not be optimal due to an inadequate volume of blood received in culture bottles Performed at [facility], [address] [place], [place] [address]    Culture   Final    NO GROWTH 3 DAYS Performed at [facility], [address] [place], [place] [address]    Report Status PENDING  Incomplete    Please note: You were cared for by a hospitalist during your hospital stay. Once you are discharged, your primary care physician will handle any further medical issues. Please note that NO REFILLS for any discharge medications will be authorized once you are discharged, as it is imperative that you return to your primary care physician (or establish a relationship with a primary care physician if you do not have one) for your post hospital discharge needs so that they can reassess your need for medications and monitor your lab values.    Time coordinating discharge: 40 minutes  SIGNED:   Burnadette Pop, MD  [facility] 11/11/2020, 11:39 AM Pager [phone]  If 7PM-7AM, please contact night-coverage [email or web address] Password [email or web address]

## 2020-11-11 NOTE — Progress Notes (Signed)
Patient and son seen at bedside. Arrange for follow-up with Dr. Theodis Sato in [place] on 11/19/20 at 9am. Address provided to family.  No changes in current medications. Okay to discharge home.  Laurance Flatten, MD

## 2020-11-12 ENCOUNTER — Telehealth: Payer: Self-pay | Admitting: *Deleted

## 2020-11-12 LAB — RHEUMATOID FACTOR: Rheumatoid fact SerPl-aCnc: 47.7 IU/mL — ABNORMAL HIGH (ref <14.0)

## 2020-11-12 LAB — CYCLIC CITRUL PEPTIDE ANTIBODY, IGG/IGA: CCP Antibodies IgG/IgA: >250 units — ABNORMAL HIGH (ref 0–19)

## 2020-11-12 NOTE — Telephone Encounter (Signed)
-----   Message from Pricilla Holm sent at 11/12/2020 12:25 PM EST ----- Done   fax to [facility]  downstairs   appt 11-19-20 @ 9 am  ----- Message ----- From: Loa Socks, LPN Sent: [phone]/78/9381   3:41 PM EST To: [facility], [facility]   ----- Message ----- From: Meriam Sprague, MD Sent: 11/10/2020   3:39 PM EST To: [facility] Triage  Do you mind faxing the demographic information for this patient to Dr[facility] in [place]? I just called to arrange an appointment for her but I can't figure out how to fax the demographic sheet.   Thank you so much!  Sincerely,  Herbert Seta

## 2020-11-13 LAB — CULTURE, BLOOD (ROUTINE X 2)
Culture: NO GROWTH
Culture: NO GROWTH
Special Requests: ADEQUATE

## 2020-11-16 ENCOUNTER — Encounter: Payer: Self-pay | Admitting: Internal Medicine

## 2020-11-16 NOTE — Progress Notes (Signed)
Dictation on: 11/16/2020  5:45 PM by: Burnadette Pop [[id number]]

## 2020-11-27 ENCOUNTER — Other Ambulatory Visit (HOSPITAL_COMMUNITY): Payer: Self-pay | Admitting: Cardiovascular Disease

## 2020-11-27 DIAGNOSIS — R0902 Hypoxemia: Secondary | ICD-10-CM

## 2020-11-27 DIAGNOSIS — I370 Nonrheumatic pulmonary valve stenosis: Secondary | ICD-10-CM

## 2020-12-07 ENCOUNTER — Other Ambulatory Visit: Payer: Self-pay

## 2020-12-07 ENCOUNTER — Ambulatory Visit (HOSPITAL_COMMUNITY)
Admission: RE | Admit: 2020-12-07 | Discharge: 2020-12-07 | Disposition: A | Discharge status: Home / Self Care | Payer: Self-pay | Source: Ambulatory Visit | Attending: Internal Medicine | Admitting: Internal Medicine

## 2020-12-07 ENCOUNTER — Other Ambulatory Visit (HOSPITAL_COMMUNITY): Payer: Self-pay | Admitting: Cardiovascular Disease

## 2020-12-07 DIAGNOSIS — R0902 Hypoxemia: Secondary | ICD-10-CM

## 2020-12-07 DIAGNOSIS — I10 Essential (primary) hypertension: Secondary | ICD-10-CM | POA: Insufficient documentation

## 2020-12-07 DIAGNOSIS — E118 Type 2 diabetes mellitus with unspecified complications: Secondary | ICD-10-CM | POA: Insufficient documentation

## 2020-12-07 DIAGNOSIS — I370 Nonrheumatic pulmonary valve stenosis: Secondary | ICD-10-CM | POA: Insufficient documentation

## 2020-12-07 DIAGNOSIS — Q211 Atrial septal defect: Secondary | ICD-10-CM | POA: Insufficient documentation

## 2020-12-07 NOTE — Progress Notes (Signed)
  Echocardiogram 2D Echocardiogram has been performed.  Tye Savoy 12/07/2020, 11:48 AM

## 2020-12-09 LAB — ECHOCARDIOGRAM COMPLETE BUBBLE STUDY
Area-P 1/2: 2.84 cm2
S' Lateral: 3.3 cm

## 2020-12-31 ENCOUNTER — Other Ambulatory Visit (HOSPITAL_COMMUNITY): Payer: Self-pay | Admitting: *Deleted

## 2020-12-31 ENCOUNTER — Ambulatory Visit (HOSPITAL_COMMUNITY): Payer: Self-pay | Attending: Cardiovascular Disease

## 2020-12-31 ENCOUNTER — Other Ambulatory Visit: Payer: Self-pay

## 2020-12-31 DIAGNOSIS — I370 Nonrheumatic pulmonary valve stenosis: Secondary | ICD-10-CM

## 2022-09-17 IMAGING — DX DG CHEST 1V PORT
1 series · 1 of 1 positions shown · non-contrast
Comparison: None.

CLINICAL DATA: Low O2 sats

EXAM:
PORTABLE CHEST 1 VIEW

[chest ap]
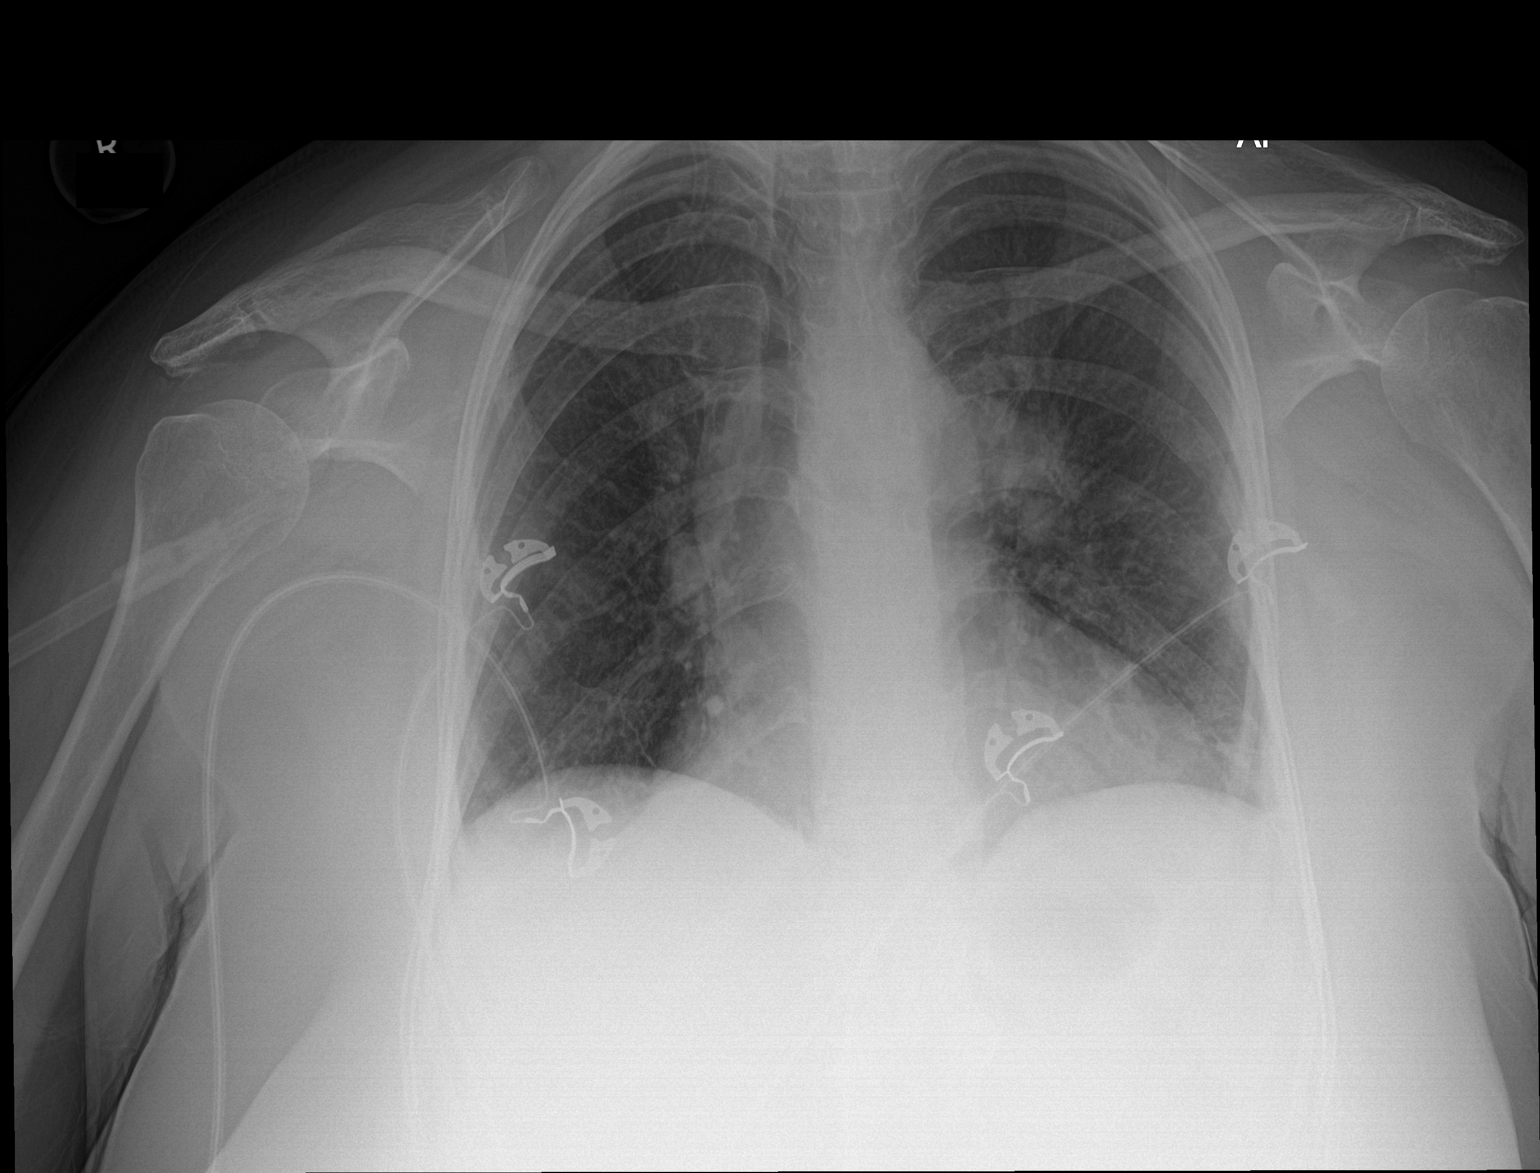

[1 of 1 positions shown; findings below may reference images not displayed]

FINDINGS: The heart size and mediastinal contours are within normal limits.
There appears to be a hazy left perihilar opacities. A somewhat
nodular appearance is seen within the left perihilar region which
measures 1 cm. The visualized skeletal structures are unremarkable.
IMPRESSION: Left perihilar airspace opacities and a 1 cm left perihilar nodular
opacity, which is non-specific and would recommend dedicated chest
CT for further evaluation.

## 2022-09-17 IMAGING — CT CT ANGIO CHEST
2 of 8 series · 18 of 36 positions shown · IV contrast (omnipaque)
Comparison: None.

CLINICAL DATA: Low O2 sats

EXAM:
CT ANGIOGRAPHY CHEST WITH CONTRAST
TECHNIQUE: Multidetector CT imaging of the chest was performed using the
standard protocol during bolus administration of intravenous
contrast. Multiplanar CT image reconstructions and MIPs were
obtained to evaluate the vascular anatomy.
CONTRAST:  75mL OMNIPAQUE IOHEXOL 350 MG/ML SOLN

[Series 6: pe coronal mpr · coronal · 0.56mm/px · 1 of 125 slices shown]
[im 63/125  mediastinal]
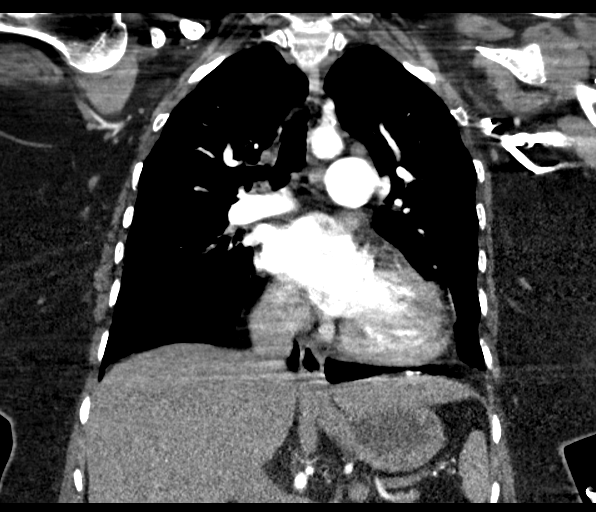

[Series 10: pe thins · axial · 0.57mm/px · z∈[+984,+1226]mm · 17 of 272 slices shown]
[im 15/272  lung]
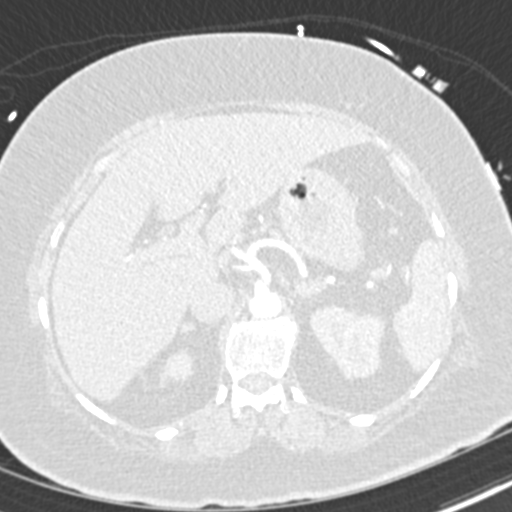
[im 29/272  mediastinal]
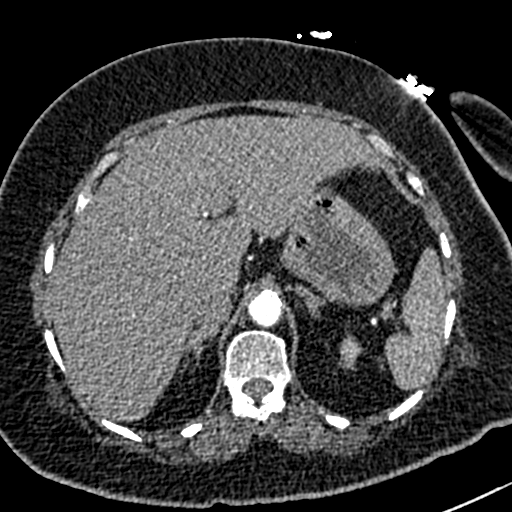
[im 43/272  lung]
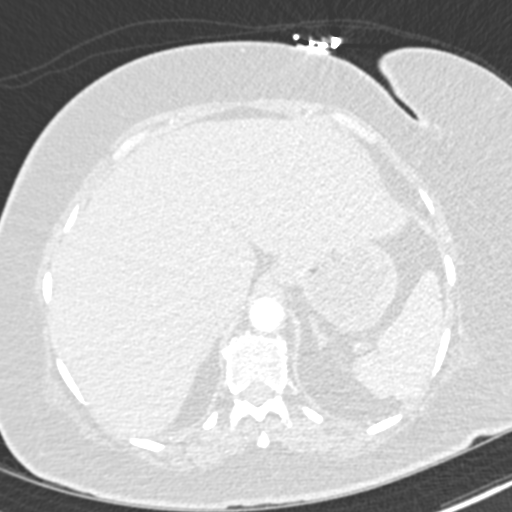
[im 58/272  mediastinal]
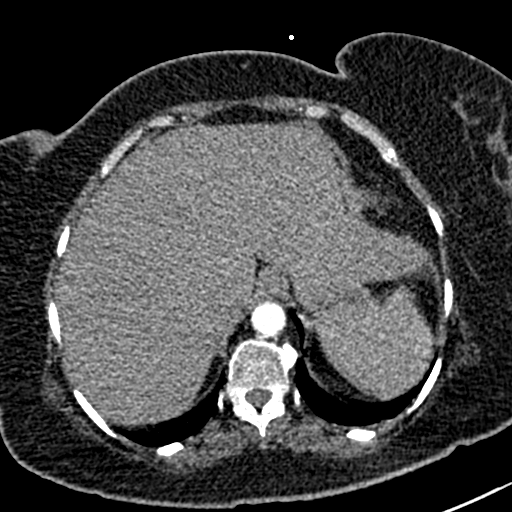
[im 72/272  lung]
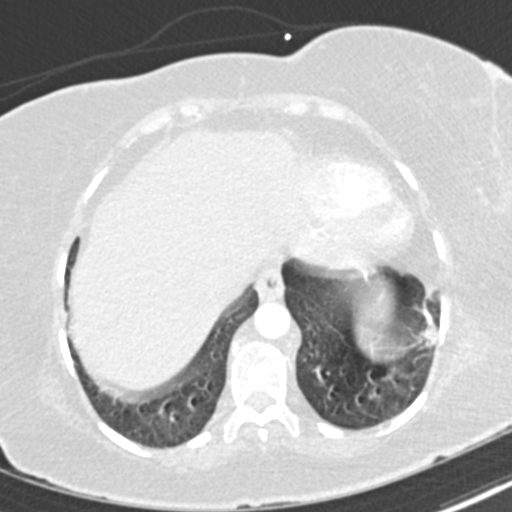
[im 86/272  mediastinal]
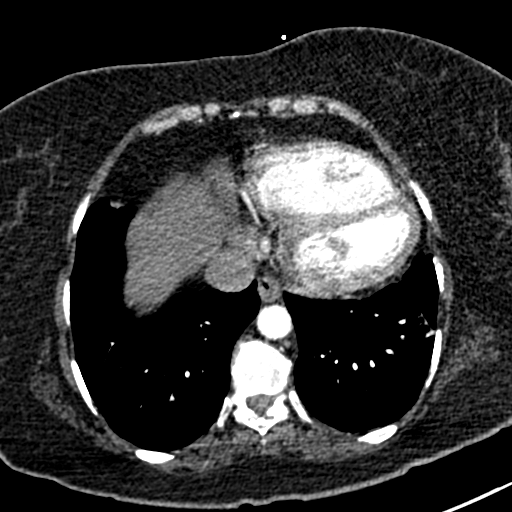
[im 100/272  lung]
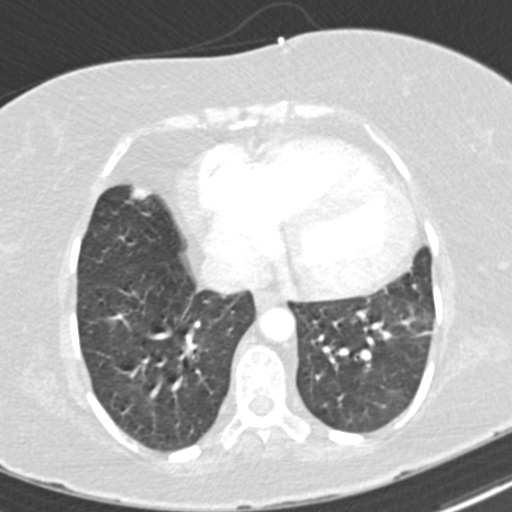
[im 115/272  mediastinal]
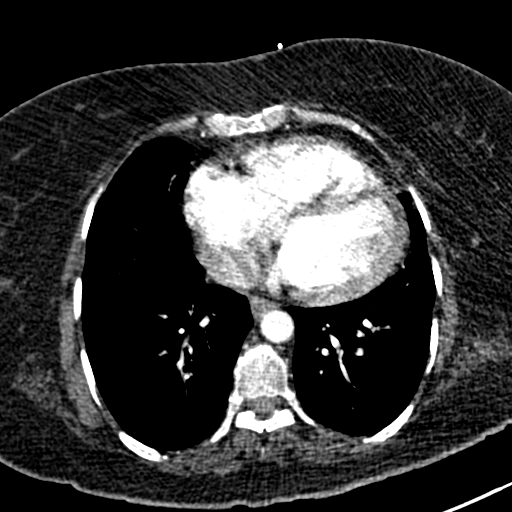
[im 143/272  lung]
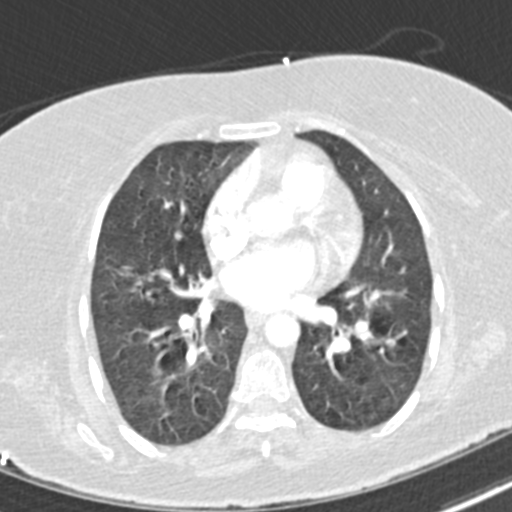
[im 157/272  mediastinal]
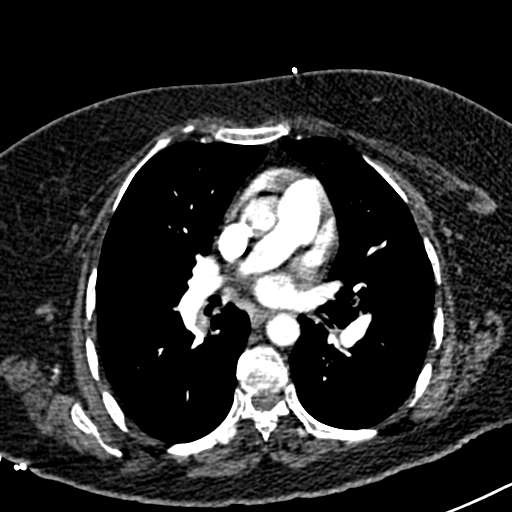
[im 172/272  lung]
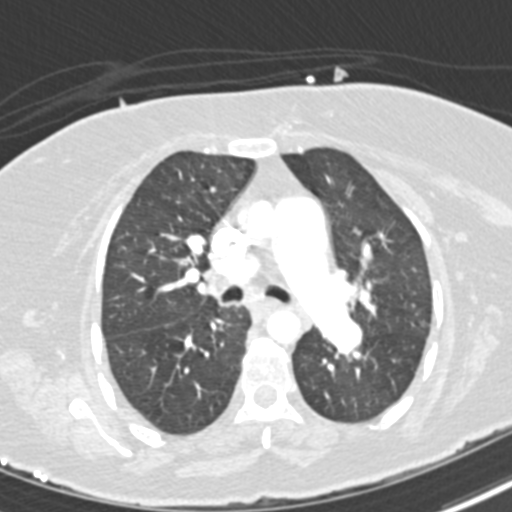
[im 186/272  mediastinal]
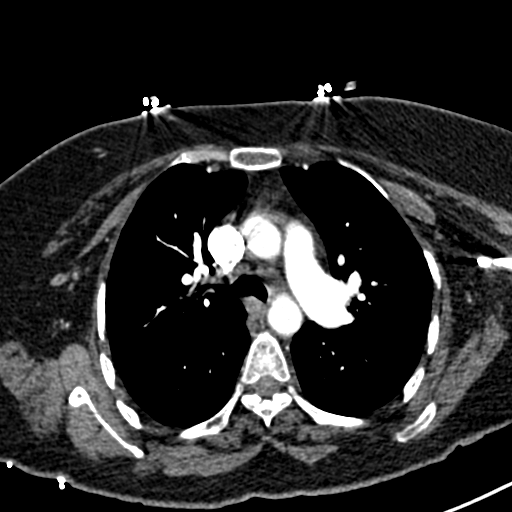
[im 200/272  lung]
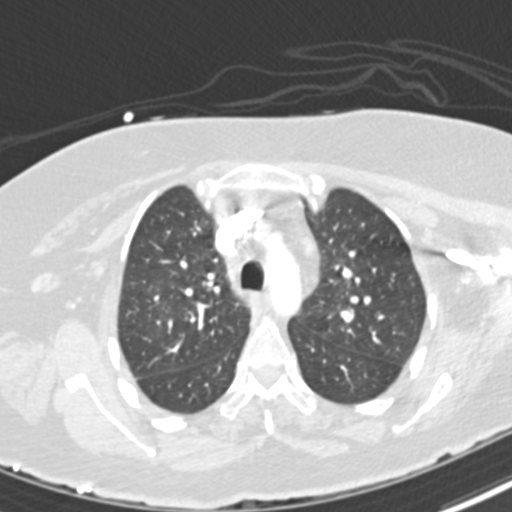
[im 214/272  mediastinal]
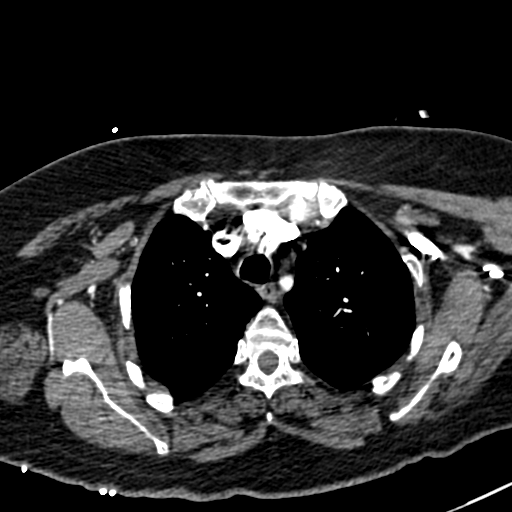
[im 229/272  lung]
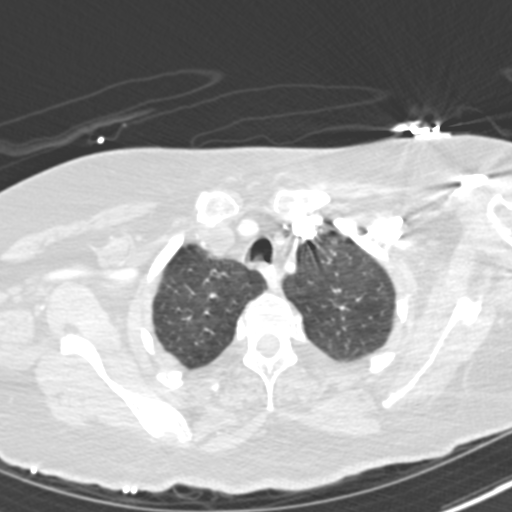
[im 243/272  mediastinal]
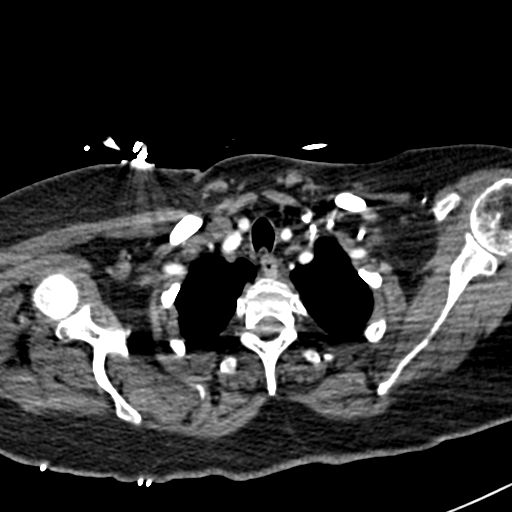
[im 257/272  lung]
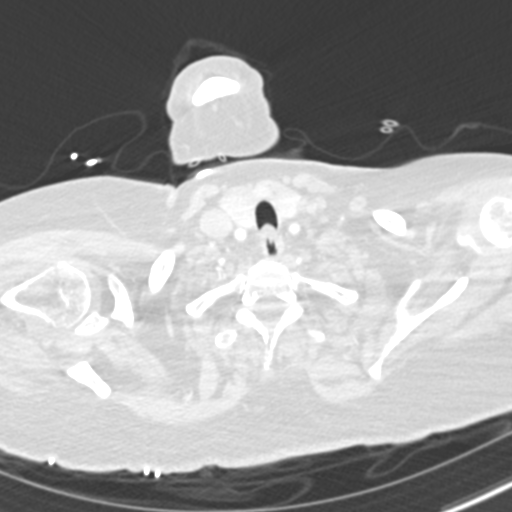

[18 of 36 positions shown; findings below may reference images not displayed]

FINDINGS: Cardiovascular: There is a optimal opacification of the pulmonary
arteries. There is no central,segmental, or subsegmental filling
defects within the pulmonary arteries. The heart is normal in size.
No pericardial effusion or thickening. No evidence right heart
strain. There is normal three-vessel brachiocephalic anatomy without
proximal stenosis. The thoracic aorta is normal in appearance.

Mediastinum/Nodes: No hilar, mediastinal, or axillary adenopathy.
Thyroid gland, trachea, and esophagus demonstrate no significant
findings.

Lungs/Pleura: Hazy airspace opacity seen at the periphery of the
periphery of the posterior left lung base. There is a small 4 mm
nodule seen within the anterior right middle lobe. No pleural
effusion or pneumothorax is noted.

Upper Abdomen: No acute abnormalities present in the visualized
portions of the upper abdomen.

Musculoskeletal: No chest wall abnormality. There is an 8 mm round
sclerotic lesion seen within the T10 vertebral body.

Review of the MIP images confirms the above findings.
IMPRESSION: No central, segmental, or subsegmental pulmonary embolism.

Mildly hazy airspace opacity within the posterior left lung base
which could be due to atelectasis and/or early infectious etiology.

4 mm pulmonary nodule within the right middle lobe. No follow-up
needed if patient is low-risk. Non-contrast chest CT can be
considered in 12 months if patient is high-risk. This recommendation
follows the consensus statement: Guidelines for Management of
Incidental Pulmonary Nodules Detected on CT Images: From the
